# Patient Record
Sex: Female | Born: 1975 | Race: White | Hispanic: Yes | Marital: Married | Smoking: Never smoker
Health system: Southern US, Community
[De-identification: ages and names within clinical notes are randomized; demographics above are authoritative.]

## PROBLEM LIST (undated history)

## (undated) DIAGNOSIS — A599 Trichomoniasis, unspecified: Secondary | ICD-10-CM

## (undated) DIAGNOSIS — R87629 Unspecified abnormal cytological findings in specimens from vagina: Secondary | ICD-10-CM

## (undated) DIAGNOSIS — R030 Elevated blood-pressure reading, without diagnosis of hypertension: Secondary | ICD-10-CM

## (undated) DIAGNOSIS — N644 Mastodynia: Secondary | ICD-10-CM

## (undated) DIAGNOSIS — N63 Unspecified lump in unspecified breast: Secondary | ICD-10-CM

## (undated) HISTORY — DX: Mastodynia: N64.4

## (undated) HISTORY — DX: Trichomoniasis, unspecified: A59.9

## (undated) HISTORY — PX: TUBAL LIGATION: SHX77

## (undated) HISTORY — DX: Unspecified lump in unspecified breast: N63.0

## (undated) HISTORY — DX: Elevated blood-pressure reading, without diagnosis of hypertension: R03.0

## (undated) HISTORY — DX: Unspecified abnormal cytological findings in specimens from vagina: R87.629

---

## 2003-01-07 ENCOUNTER — Inpatient Hospital Stay (HOSPITAL_COMMUNITY): Admission: AD | Admit: 2003-01-07 | Discharge: 2003-01-10 | Payer: Self-pay | Admitting: Obstetrics & Gynecology

## 2004-09-26 ENCOUNTER — Inpatient Hospital Stay (HOSPITAL_COMMUNITY): Admission: RE | Admit: 2004-09-26 | Discharge: 2004-09-28 | Payer: Self-pay | Admitting: Obstetrics and Gynecology

## 2005-02-23 ENCOUNTER — Encounter: Admission: RE | Admit: 2005-02-23 | Discharge: 2005-02-23 | Payer: Self-pay | Admitting: Specialist

## 2014-04-08 ENCOUNTER — Other Ambulatory Visit: Payer: Self-pay | Admitting: Adult Health

## 2014-06-08 ENCOUNTER — Emergency Department (HOSPITAL_COMMUNITY)
Admission: EM | Admit: 2014-06-08 | Discharge: 2014-06-08 | Disposition: A | Discharge status: Home / Self Care | Payer: 59 | Attending: Emergency Medicine | Admitting: Emergency Medicine

## 2014-06-08 ENCOUNTER — Encounter (HOSPITAL_COMMUNITY): Payer: Self-pay | Admitting: Emergency Medicine

## 2014-06-08 ENCOUNTER — Emergency Department (HOSPITAL_COMMUNITY): Payer: 59

## 2014-06-08 DIAGNOSIS — S060X0A Concussion without loss of consciousness, initial encounter: Secondary | ICD-10-CM

## 2014-06-08 DIAGNOSIS — S3992XA Unspecified injury of lower back, initial encounter: Secondary | ICD-10-CM | POA: Insufficient documentation

## 2014-06-08 DIAGNOSIS — Y92410 Unspecified street and highway as the place of occurrence of the external cause: Secondary | ICD-10-CM | POA: Insufficient documentation

## 2014-06-08 DIAGNOSIS — S79922A Unspecified injury of left thigh, initial encounter: Secondary | ICD-10-CM | POA: Diagnosis not present

## 2014-06-08 DIAGNOSIS — Y9389 Activity, other specified: Secondary | ICD-10-CM | POA: Insufficient documentation

## 2014-06-08 DIAGNOSIS — Y998 Other external cause status: Secondary | ICD-10-CM | POA: Insufficient documentation

## 2014-06-08 DIAGNOSIS — S0990XA Unspecified injury of head, initial encounter: Secondary | ICD-10-CM | POA: Diagnosis present

## 2014-06-08 MED ORDER — PROMETHAZINE HCL 25 MG PO TABS: 25.0000 mg | ORAL_TABLET | Freq: Four times a day (QID) | ORAL | Status: DC | PRN

## 2014-06-08 MED ORDER — TRAMADOL HCL 50 MG PO TABS
50.0000 mg | ORAL_TABLET | Freq: Once | ORAL | Status: AC
  Administered 2014-06-08: 50 mg via ORAL
  Filled 2014-06-08: qty 1

## 2014-06-08 MED ORDER — ONDANSETRON 4 MG PO TBDP
4.0000 mg | ORAL_TABLET | Freq: Once | ORAL | Status: AC
  Administered 2014-06-08: 4 mg via ORAL
  Filled 2014-06-08: qty 1

## 2014-06-08 MED ORDER — NAPROXEN 500 MG PO TABS: 500.0000 mg | ORAL_TABLET | Freq: Two times a day (BID) | ORAL | Status: DC

## 2014-06-08 NOTE — Discharge Instructions (Signed)
[facility] Primary Care Doctor List    Sinda Du MD. Specialty: Pulmonary Disease Contact information: [place] [address]  [phone]   Tula Nakayama, MD. Specialty: [facility] Medicine Contact information: [address] [address]  [phone]   Sallee Lange, MD. Specialty: [facility] Medicine Contact information: [place]  [place] [address]  [phone]   Rosita Fire, MD Specialty: Internal Medicine Contact information: [place] [address]  [phone]   Delphina Cahill, MD. Specialty: Internal Medicine Contact information: [place] [address]  [phone]   Marjean Donna, MD. Specialty: Family Medicine Contact information: [place] [address]  [phone]   Leslie Andrea, MD. Specialty: [facility] Medicine Contact information: [place] [address]  [phone]   Asencion Noble, MD. Specialty: Internal Medicine Contact information: [place]  [place] [address]  [phone]
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
## 2014-12-08 ENCOUNTER — Other Ambulatory Visit: Payer: Self-pay | Admitting: Adult Health

## 2014-12-08 ENCOUNTER — Other Ambulatory Visit (HOSPITAL_COMMUNITY)
Admission: RE | Admit: 2014-12-08 | Discharge: 2014-12-08 | Disposition: A | Discharge status: Home / Self Care | Payer: 59 | Source: Ambulatory Visit | Attending: Adult Health | Admitting: Adult Health

## 2014-12-08 ENCOUNTER — Encounter: Payer: Self-pay | Admitting: Adult Health

## 2014-12-08 ENCOUNTER — Ambulatory Visit (INDEPENDENT_AMBULATORY_CARE_PROVIDER_SITE_OTHER): Payer: 59 | Admitting: Adult Health

## 2014-12-08 VITALS — BP 140/90 | HR 72 | Ht 62.0 in | Wt 164.5 lb

## 2014-12-08 DIAGNOSIS — Z1212 Encounter for screening for malignant neoplasm of rectum: Secondary | ICD-10-CM

## 2014-12-08 DIAGNOSIS — Z01419 Encounter for gynecological examination (general) (routine) without abnormal findings: Secondary | ICD-10-CM | POA: Insufficient documentation

## 2014-12-08 DIAGNOSIS — N63 Unspecified lump in unspecified breast: Secondary | ICD-10-CM

## 2014-12-08 DIAGNOSIS — R030 Elevated blood-pressure reading, without diagnosis of hypertension: Secondary | ICD-10-CM

## 2014-12-08 DIAGNOSIS — IMO0001 Reserved for inherently not codable concepts without codable children: Secondary | ICD-10-CM

## 2014-12-08 DIAGNOSIS — Z1151 Encounter for screening for human papillomavirus (HPV): Secondary | ICD-10-CM | POA: Insufficient documentation

## 2014-12-08 DIAGNOSIS — N644 Mastodynia: Secondary | ICD-10-CM

## 2014-12-08 DIAGNOSIS — I10 Essential (primary) hypertension: Secondary | ICD-10-CM | POA: Insufficient documentation

## 2014-12-08 HISTORY — DX: Mastodynia: N64.4

## 2014-12-08 HISTORY — DX: Reserved for inherently not codable concepts without codable children: IMO0001

## 2014-12-08 HISTORY — DX: Unspecified lump in unspecified breast: N63.0

## 2014-12-08 LAB — HEMOCCULT GUIAC POC 1CARD (OFFICE): FECAL OCCULT BLD: NEGATIVE

## 2014-12-08 NOTE — Progress Notes (Signed)
Patient ID: Julie Butler, female   DOB: [birth year], 39 y.o.   MRN: [id number] History of Present Illness: Julie Butler is a 39 year old Hispanic female,G4P4, in for well woman gyn exam and pap.She is complaining of pain in left breast/chest area and has not had pap lately, is thinking had abnormal pap ?cancer but has not had one since.Has pain in both knees at times.  She says she is nervous because she has not seen provider lately.  Current Medications, Allergies, Past Medical History, Past Surgical History, Family History and Social History were reviewed in [employer] record.     Review of Systems: Patient denies any headaches, hearing loss, fatigue, blurred vision, shortness of breath, abdominal pain, problems with bowel movements, urination, or intercourse. No joint pain or mood swings. See HPI for positives.   Physical Exam:BP 140/90 mmHg  Pulse 72  Ht 5\' 2"  (1.575 m)  Wt 164 lb 8 oz (74.617 kg)  BMI 30.08 kg/m2  LMP 11/27/2014 General:  Well developed, well nourished, no acute distress Skin:  Warm and dry Neck:  Midline trachea, normal thyroid, good ROM, no lymphadenopathy Lungs; Clear to auscultation bilaterally Breast:  No dominant palpable mass, retraction, or nipple discharge on the right, on the left has retraction or nipple discharge but has nodule at 1-2 o'clock and 9 o'clock and both are tender Cardiovascular: Regular rate and rhythm Abdomen:  Soft, non tender, no hepatosplenomegaly Pelvic:  External genitalia is normal in appearance, no lesions.  The vagina is normal in appearance. Urethra has no lesions or masses. The cervix is bulbous and friable, pap with HPV performed.  Uterus is felt to be normal size, shape, and contour.  No adnexal masses or tenderness noted.Bladder is non tender, no masses felt. Rectal: Good sphincter tone, no polyps, or hemorrhoids felt.  Hemoccult negative. Extremities/musculoskeletal:  No swelling or varicosities noted, no  clubbing or cyanosis Psych:  No mood changes, alert and cooperative,seems happy   Impression: Well woman gyn exam with pap Breast nodule,left Breast pain,left Elevated BP    Plan: Check CBC,CMP,TSH and lipids Return in 1 week for BP check and review labs Get diagnostic bilateral mammogram 10/18 at 8 am at [facility] in 1 year

## 2014-12-08 NOTE — Patient Instructions (Signed)
Get [facility] 10[facility]18 at 8 [facility] Return in 1 week for BP check and review labs Physical in 1 year

## 2014-12-09 LAB — COMPREHENSIVE METABOLIC PANEL
A/G RATIO: 1.5 (ref 1.1–2.5)
ALT: 13 IU/L (ref 0–32)
AST: 16 IU/L (ref 0–40)
Albumin: 4.3 g/dL (ref 3.5–5.5)
Alkaline Phosphatase: 72 IU/L (ref 39–117)
BUN/Creatinine Ratio: 15 (ref 8–20)
BUN: 10 mg/dL (ref 6–20)
Bilirubin Total: 0.6 mg/dL (ref 0.0–1.2)
CALCIUM: 9.1 mg/dL (ref 8.7–10.2)
CO2: 22 mmol/L (ref 18–29)
CREATININE: 0.65 mg/dL (ref 0.57–1.00)
Chloride: 103 mmol/L (ref 97–108)
GFR, EST AFRICAN AMERICAN: 129 mL/min/{1.73_m2} (ref >59)
GFR, EST NON AFRICAN AMERICAN: 112 mL/min/{1.73_m2} (ref >59)
GLOBULIN, TOTAL: 2.9 g/dL (ref 1.5–4.5)
Glucose: 89 mg/dL (ref 65–99)
POTASSIUM: 4 mmol/L (ref 3.5–5.2)
Sodium: 143 mmol/L (ref 134–144)
TOTAL PROTEIN: 7.2 g/dL (ref 6.0–8.5)

## 2014-12-09 LAB — CBC
HEMATOCRIT: 42.5 % (ref 34.0–46.6)
Hemoglobin: 14.4 g/dL (ref 11.1–15.9)
MCH: 29.3 pg (ref 26.6–33.0)
MCHC: 33.9 g/dL (ref 31.5–35.7)
MCV: 87 fL (ref 79–97)
PLATELETS: 246 10*3/uL (ref 150–379)
RBC: 4.91 x10E6/uL (ref 3.77–5.28)
RDW: 13.6 % (ref 12.3–15.4)
WBC: 5.6 10*3/uL (ref 3.4–10.8)

## 2014-12-09 LAB — LIPID PANEL
CHOL/HDL RATIO: 3.9 ratio (ref 0.0–4.4)
CHOLESTEROL TOTAL: 175 mg/dL (ref 100–199)
HDL: 45 mg/dL (ref >39)
LDL CALC: 101 mg/dL — AB (ref 0–99)
TRIGLYCERIDES: 147 mg/dL (ref 0–149)
VLDL CHOLESTEROL CAL: 29 mg/dL (ref 5–40)

## 2014-12-09 LAB — TSH: TSH: 1.26 u[IU]/mL (ref 0.450–4.500)

## 2014-12-13 LAB — CYTOLOGY - PAP

## 2014-12-14 ENCOUNTER — Ambulatory Visit: Payer: 59 | Admitting: Adult Health

## 2014-12-16 ENCOUNTER — Ambulatory Visit (INDEPENDENT_AMBULATORY_CARE_PROVIDER_SITE_OTHER): Payer: 59 | Admitting: Adult Health

## 2014-12-16 ENCOUNTER — Encounter: Payer: Self-pay | Admitting: Adult Health

## 2014-12-16 VITALS — BP 140/88 | HR 68 | Ht 62.0 in | Wt 165.0 lb

## 2014-12-16 DIAGNOSIS — A599 Trichomoniasis, unspecified: Secondary | ICD-10-CM | POA: Diagnosis not present

## 2014-12-16 DIAGNOSIS — R030 Elevated blood-pressure reading, without diagnosis of hypertension: Secondary | ICD-10-CM

## 2014-12-16 DIAGNOSIS — IMO0001 Reserved for inherently not codable concepts without codable children: Secondary | ICD-10-CM

## 2014-12-16 HISTORY — DX: Trichomoniasis, unspecified: A59.9

## 2014-12-16 MED ORDER — METRONIDAZOLE 500 MG PO TABS: ORAL_TABLET | ORAL | Status: DC

## 2014-12-16 NOTE — Patient Instructions (Addendum)

## 2014-12-16 NOTE — Progress Notes (Signed)
Subjective:     Patient ID: Julie Butler, female   DOB: 15-Jan-1976, 39 y.o.   MRN: [id number]  HPI Julie Butler is a 39 year old Hispanic female in for BP check and review labs.  Review of Systems Patient denies any headaches, hearing loss, fatigue, blurred vision, shortness of breath, chest pain, abdominal pain, problems with bowel movements, urination, or intercourse. No joint pain or mood swings. Reviewed past medical,surgical, social and family history. Reviewed medications and allergies.     Objective:   Physical Exam BP 140/88 mmHg  Pulse 68  Ht 5\' 2"  (1.575 m)  Wt 165 lb (74.844 kg)  BMI 30.17 kg/m2  LMP 09/24/2016Reviewed labs and Pap smear results,had trich on pap will treat, and discussed weight loss to help with BP as she does not wants meds.Face time 10 minutes, counseling, try to lose 1 lb per week,she has appt Tuesday for mammogram.     Assessment:     Trichomoniasis Elevated BP    Plan:    Review handout on weight loss Rx flagyl 500 mg #4 4 po now Try to lose weight Follow up in 7 weeks

## 2014-12-21 ENCOUNTER — Inpatient Hospital Stay (HOSPITAL_COMMUNITY): Admission: RE | Admit: 2014-12-21 | Payer: 59 | Source: Ambulatory Visit

## 2015-01-11 ENCOUNTER — Ambulatory Visit (HOSPITAL_COMMUNITY)
Admission: RE | Admit: 2015-01-11 | Discharge: 2015-01-11 | Disposition: A | Discharge status: Home / Self Care | Payer: 59 | Source: Ambulatory Visit | Attending: Adult Health | Admitting: Adult Health

## 2015-01-11 DIAGNOSIS — N644 Mastodynia: Secondary | ICD-10-CM | POA: Insufficient documentation

## 2015-02-03 ENCOUNTER — Ambulatory Visit: Payer: 59 | Admitting: Adult Health

## 2015-02-07 ENCOUNTER — Ambulatory Visit (INDEPENDENT_AMBULATORY_CARE_PROVIDER_SITE_OTHER): Payer: 59 | Admitting: Adult Health

## 2015-02-07 ENCOUNTER — Encounter: Payer: Self-pay | Admitting: Adult Health

## 2015-02-07 VITALS — BP 140/80 | HR 68 | Ht 62.0 in | Wt 168.5 lb

## 2015-02-07 DIAGNOSIS — A599 Trichomoniasis, unspecified: Secondary | ICD-10-CM

## 2015-02-07 DIAGNOSIS — IMO0001 Reserved for inherently not codable concepts without codable children: Secondary | ICD-10-CM

## 2015-02-07 DIAGNOSIS — R030 Elevated blood-pressure reading, without diagnosis of hypertension: Secondary | ICD-10-CM

## 2015-02-07 LAB — POCT WET PREP (WET MOUNT): WBC WET PREP: NEGATIVE

## 2015-02-07 NOTE — Progress Notes (Signed)
Subjective:     Patient ID: Julie Butler, female   DOB: [birth year], 39 y.o.   MRN: [id number]  HPI Kaleyah is a 39 year old Hispanic female in for proof of treatment on trich and BP check, she says BP like 126/80 at [facility].No complaints today.  Review of Systems Patient denies any headaches, hearing loss, fatigue, blurred vision, shortness of breath, chest pain, abdominal pain, problems with bowel movements, urination, or intercourse. No joint pain or mood swings. Reviewed past medical,surgical, social and family history. Reviewed medications and allergies.     Objective:   Physical Exam BP 140/80 mmHg  Pulse 68  Ht 5\' 2"  (1.575 m)  Wt 168 lb 8 oz (76.431 kg)  BMI 30.81 kg/m2  LMP 01/27/2015 Skin warm and dry. Lungs: clear to ausculation bilaterally. Cardiovascular: regular rate and rhythm.Pelvic: external genitalia is normal in appearance no lesions, vagina: white discharge without odor,urethra has no lesions or masses noted, cervix:smooth and bulbous, uterus: normal size, shape and contour, non tender, no masses felt, adnexa: no masses or tenderness noted. Bladder is non tender and no masses felt. Wet prep: negative     Assessment:     Trichomoniasis resolved Elevated BP    Plan:     Continue to decrease salt and sugar Follow up in 3 months for BP check and ROS

## 2015-02-07 NOTE — Patient Instructions (Signed)
Watch diet  Follow up in 3 months for BP check

## 2015-05-10 ENCOUNTER — Ambulatory Visit: Payer: 59 | Admitting: Adult Health

## 2015-05-16 ENCOUNTER — Ambulatory Visit: Payer: 59 | Admitting: Adult Health

## 2015-05-16 ENCOUNTER — Encounter: Payer: Self-pay | Admitting: *Deleted

## 2017-11-11 ENCOUNTER — Encounter: Payer: Self-pay | Admitting: Obstetrics and Gynecology

## 2017-11-11 ENCOUNTER — Ambulatory Visit (INDEPENDENT_AMBULATORY_CARE_PROVIDER_SITE_OTHER): Payer: Self-pay | Admitting: Obstetrics and Gynecology

## 2017-11-11 ENCOUNTER — Other Ambulatory Visit (HOSPITAL_COMMUNITY)
Admission: RE | Admit: 2017-11-11 | Discharge: 2017-11-11 | Disposition: A | Discharge status: Home / Self Care | Payer: 59 | Source: Ambulatory Visit | Attending: Obstetrics and Gynecology | Admitting: Obstetrics and Gynecology

## 2017-11-11 ENCOUNTER — Encounter (INDEPENDENT_AMBULATORY_CARE_PROVIDER_SITE_OTHER): Payer: Self-pay

## 2017-11-11 VITALS — BP 146/91 | HR 66 | Ht 62.5 in | Wt 180.8 lb

## 2017-11-11 DIAGNOSIS — Z1211 Encounter for screening for malignant neoplasm of colon: Secondary | ICD-10-CM

## 2017-11-11 DIAGNOSIS — Z1212 Encounter for screening for malignant neoplasm of rectum: Secondary | ICD-10-CM

## 2017-11-11 DIAGNOSIS — Z01419 Encounter for gynecological examination (general) (routine) without abnormal findings: Secondary | ICD-10-CM

## 2017-11-11 LAB — HEMOCCULT GUIAC POC 1CARD (OFFICE): FECAL OCCULT BLD: NEGATIVE

## 2017-11-11 NOTE — Progress Notes (Signed)
Patient ID: Julie Butler, female   DOB: 1975/05/17, 42 y.o.   MRN: [id number]   Assessment:  Annual Gyn Exam Plan:  1. pap smear done, next pap due 3 years 2. return annually or prn 3    mammogram at 44 Subjective:  Julie Butler is a 42 y.o. female G4P4 who presents for annual exam. Patient's last menstrual period was 10/17/2017. The patient has complaints today of high blood pressure and pain on her left side. She constant has pain in her stomach. She hasn't changed her diet and certain foods does not make her stomach upset.  When she uses her left hand a lot it makes the pain worse, when she sleeps on right side the pain goes away. She workings as a cleaning lady; sometimes homes and sometimes in stores. She has no family history of breast cancer. LMP 10/17/17 She had all her children vaginally they are 37, 21, 36, 83 The following portions of the patient's history were reviewed and updated as appropriate: allergies, current medications, past family history, past medical history, past social history, past surgical history and problem list. Past Medical History:  Diagnosis Date  . Breast nodule 12/08/2014  . Breast pain, left 12/08/2014  . Elevated BP 12/08/2014  . Trichimoniasis 12/16/2014  . Vaginal Pap smear, abnormal     Past Surgical History:  Procedure Laterality Date  . TUBAL LIGATION      No current outpatient medications on file.  Review of Systems Constitutional: negative Gastrointestinal: negative Genitourinary: normal  Objective:  BP (!) 149/100 (BP Location: Right Arm, Patient Position: Sitting, Cuff Size: Normal)   Pulse 73   Ht 5' 2.5" (1.588 m)   Wt 180 lb 12.8 oz (82 kg)   LMP 10/17/2017   BMI 32.54 kg/m    BMI: Body mass index is 32.54 kg/m.  General Appearance: Alert, appropriate appearance for age. No acute distress HEENT: Grossly normal Neck / Thyroid:  Cardiovascular: RRR; normal S1, S2, no murmur Lungs: CTA bilaterally Back: No CVAT Breast  Exam: No dimpling, nipple retraction or discharge. No masses or nodes. and Normal breast tissue bilaterally Gastrointestinal: Soft, non-tender, no masses or organomegaly Pelvic Exam:  VAGINA: normal apperaing CERVIX: multip, normal secretions, bleeds slightly when pap collected UTERUS: normal, anterior RECTAL: guaiac negative stool obtained,  PAP: Pap smear done today.  Lymphatic Exam: Non-palpable nodes in neck, clavicular, axillary, or inguinal regions  Skin: no rash or abnormalities Neurologic: Normal gait and speech, no tremor  Psychiatric: Alert and oriented, appropriate affect.  Urinalysis:Not done  By signing my name below, I, Samul Dada, attest that this documentation has been prepared under the direction and in the presence of Jonnie Kind, MD. Electronically Signed: Babbie. 11/11/17. 11:28 AM.  I personally performed the services described in this documentation, which was SCRIBED in my presence. The recorded information has been reviewed and considered accurate. It has been edited as necessary during review. Jonnie Kind, MD

## 2017-11-12 LAB — CYTOLOGY - PAP
Diagnosis: NEGATIVE
HPV (WINDOPATH): NOT DETECTED

## 2019-01-16 ENCOUNTER — Other Ambulatory Visit: Payer: Self-pay | Admitting: *Deleted

## 2019-01-16 DIAGNOSIS — Z20822 Contact with and (suspected) exposure to covid-19: Secondary | ICD-10-CM

## 2019-01-19 LAB — NOVEL CORONAVIRUS, NAA: SARS-CoV-2, NAA: DETECTED — AB

## 2019-01-22 ENCOUNTER — Encounter: Payer: Self-pay | Admitting: *Deleted

## 2019-02-03 ENCOUNTER — Other Ambulatory Visit: Payer: Self-pay

## 2019-02-03 ENCOUNTER — Encounter: Payer: Self-pay | Admitting: Adult Health

## 2019-02-03 ENCOUNTER — Ambulatory Visit: Payer: Self-pay | Admitting: Adult Health

## 2019-02-03 VITALS — BP 130/87 | HR 78 | Ht 62.5 in | Wt 178.6 lb

## 2019-02-03 DIAGNOSIS — Z01419 Encounter for gynecological examination (general) (routine) without abnormal findings: Secondary | ICD-10-CM

## 2019-02-03 DIAGNOSIS — N3946 Mixed incontinence: Secondary | ICD-10-CM | POA: Insufficient documentation

## 2019-02-03 DIAGNOSIS — Z1212 Encounter for screening for malignant neoplasm of rectum: Secondary | ICD-10-CM

## 2019-02-03 DIAGNOSIS — Z1211 Encounter for screening for malignant neoplasm of colon: Secondary | ICD-10-CM

## 2019-02-03 LAB — HEMOCCULT GUIAC POC 1CARD (OFFICE): Fecal Occult Blood, POC: NEGATIVE

## 2019-02-03 NOTE — Progress Notes (Signed)
Patient ID: Julie Butler, female   DOB: 08-05-1975, 43 y.o.   MRN: [id number] History of Present Illness: Julie Butler is a 43 year old Hispanic female, married, G4P4 in for a well woman gyn exam, she had a normal pap with negative HPV 11/11/17. Julie Butler is her interpreter today.  Current Medications, Allergies, Past Medical History, Past Surgical History, Family History and Social History were reviewed in [employer] record.     Review of Systems: Patient denies any headaches, hearing loss, fatigue, blurred vision, shortness of breath, chest pain, abdominal pain, problems with bowel movements,  or intercourse. No joint pain or mood swings. Has leakage if coughs or sneezes, and some urge incontinence  Has some night sweats Periods regular and last 2-3 days   Physical Exam:BP 130/87 (BP Location: Left Arm, Patient Position: Sitting, Cuff Size: Normal)   Pulse 78   Ht 5' 2.5" (1.588 m)   Wt 178 lb 9.6 oz (81 kg)   LMP 01/18/2019 (Exact Date)   BMI 32.15 kg/m  General:  Well developed, well nourished, no acute distress Skin:  Warm and dry Neck:  Midline trachea, normal thyroid, good ROM, no lymphadenopathy Lungs; Clear to auscultation bilaterally Breast:  No dominant palpable mass, retraction, or nipple discharge Cardiovascular: Regular rate and rhythm Abdomen:  Soft, non tender, no hepatosplenomegaly Pelvic:  External genitalia is normal in appearance, no lesions.  The vagina is normal in appearance. Urethra has no lesions or masses. The cervix is bulbous.  Uterus is felt to be normal size, shape, and contour.  No adnexal masses or tenderness noted.Bladder is non tender, no masses felt. Rectal: Good sphincter tone, no polyps, or hemorrhoids felt.  Hemoccult negative. Extremities/musculoskeletal:  No swelling or varicosities noted, no clubbing or cyanosis Psych:  No mood changes, alert and cooperative,seems happy Fall risk is low PHQ 2 score is 0. Examination chaperoned  by Rolena Infante LPN.  Impression and Plan: 1. Encounter for well woman exam with routine gynecological exam Pap in 2022 Physical in 1 year Get mammogram   2. Screening for colorectal cancer   3. Mixed stress and urge urinary incontinence Try to lose 10 lbs Do kegel's Decrease caffeine Pee often Review handouts on kegel's and UI If not better call, can try ditropan

## 2019-02-03 NOTE — Patient Instructions (Signed)
Ejercicios de Scientist, research (physical sciences)  Los ejercicios de Kegel pueden ayudar a fortalecer los msculos del suelo plvico. El suelo plvico est formado por un grupo de msculos que sostienen el recto, el intestino delgado y la vejiga. En las mujeres, los msculos del suelo plvico tambin sostienen el tero. Estos msculos ayudan a controlar el flujo de [place] y materia fecal (heces). Los ejercicios de Kegel son indoloros y simples, y no requieren ningn equipo. El mdico puede sugerir ejercicios de Kegel para:  Mejorar el control de la vejiga y los intestinos.  Mejorar la respuesta sexual.  Mejorar los msculos dbiles del suelo plvico despus de una ciruga para extirpar el tero (histerectoma) o de un embarazo (mujeres).  Mejorar los msculos del suelo plvico dbiles despus de la extirpacin o ciruga de la prstata (hombres). Los ejercicios de Kegel consisten en contraer los msculos del suelo plvico, que son los mismos que contrae al intentar detener el flujo de la orina o evitar eliminar gases. Estos ejercicios se pueden Regulatory affairs officer est sentado, parado o [place], pero lo mejor es variar la posicin. Ejercicios Cmo hacer los ejercicios de Kegel: 1. Contraiga con fuerza los msculos del suelo plvico. Debe sentir que el rea rectal se eleva y se tensa. Si es [place], tambin debe sentir tensin en el rea vaginal. Mantenga el [place], las nalgas y las piernas [place]. 2. Mantenga los msculos tensos durante 10segundos como mximo. 3. Respire con normalidad. 4. Relaje los msculos. 5. Repita como se lo haya indicado el mdico. Repita este ejercicio a diario como se lo haya indicado el mdico. Contine haciendo este ejercicio durante al menos 4 a 6semanas o el tiempo que le haya indicado su mdico. Pueden derivarlo a un fisioterapeuta que lo puede ayudar a aprender ms sobre cmo hacer los ejercicios de Kegel. Segn sea su estado, el mdico puede recomendarle lo siguiente:   Variar cunto [employer].  Hacer varias series de ejercicios [employer].  Hacer ejercicios durante varias semanas.  Convertir los ejercicios de Kegel en parte de su rutina de ejercicios habitual. Esta informacin no tiene Marine scientist el consejo del mdico. Asegrese de hacerle al mdico cualquier pregunta que tenga. Document Released: 02/06/2012 Document Revised: 11/20/2017 Document Reviewed: 11/20/2017 Elsevier Patient Education  2020 [place] Urinary Incontinence  La incontinencia urinaria es una afeccin en la cual una persona no puede controlar dnde y Financial risk analyst. Ardelia Mems persona con esta afeccin orinar en un momento y Engineer, civil (consulting) no deseados (involuntariamente). Cules son las causas? Esta afeccin puede ser causada por lo siguiente:  Medicamentos.  Infecciones.  Estreimiento.  Msculos de la vejiga hiperactivos.  Msculos de la vejiga dbiles.  Msculos del suelo de la pelvis dbiles. Estos msculos proporcionan apoyo a la vejiga, el intestino y el tero, en el caso de las [place].  Aumento del tamao de la prstata en los hombres. La prstata es una glndula cerca de la vejiga. Cuando se [place], puede comprimir la [place]. Si la uretra est obstruida, la vejiga puede debilitarse y perder la capacidad para vaciarse correctamente.  Ciruga.  Factores emocionales, como la ansiedad, el estrs o el trastorno de estrs postraumtico (TEP).  Prolapso de los rganos de la pelvis. Esto ocurre en las mujeres, cuando los rganos se salen de Environmental consultant, hacia la vagina. Este cambio de lugar puede evitar que la vejiga y la uretra funcionen correctamente. Qu incrementa el riesgo? Los siguientes factores pueden hacer que usted sea ms propenso a  tener esta afeccin:  Edad avanzada.  Obesidad e inactividad fsica.  Embarazo y [place].  Menopausia.  Las enfermedades que [employer] nervios o la mdula espinal  (enfermedades neurolgicas).  Tos de larga duracin (crnica). Esto puede aumentar la presin en los msculos de la vejiga y del suelo plvico. Cules son los signos o los sntomas? Los sntomas pueden variar, segn el tipo de incontinencia urinaria que tenga. Estos incluyen los siguientes:  Una necesidad repentina de Garment/textile technologist, y orinar involuntariamente antes de llegar al bao (incontinencia imperiosa).  Orinar repentinamente al realizar una actividad que provoque la miccin, como toser, rer, Field seismologist ejercicio o estornudar (incontinencia urinaria de esfuerzo).  Tener ganas de orinar con frecuencia, y Garment/textile technologist en pequeas cantidades o por goteo (incontinencia por rebosamiento).  Orinar porque no puede llegar al bao a tiempo a causa de una discapacidad fsica, como la artritis o una lesin, o de afecciones comunicativas o cognitivas, como la enfermedad de Alzheimer (incontinencia funcional). Cmo se diagnostica? Esta afeccin se puede diagnosticar en funcin de lo siguiente:  Sus antecedentes mdicos.  Un examen fsico.  Estudios, como por ejemplo: ? Anlisis de [place]. ? Radiografas del rin y de la vejiga. ? Ecografa. ? Exploracin por tomografa computarizada (TC). ? Cistoscopia. En este procedimiento, el mdico inserta un tubo con Ardelia Mems luz y Carlota Raspberry (cistoscopio) por la uretra hacia la vejiga para Education officer, environmental. ? Estudios urodinmicos. Estos estudios evalan la capacidad de la vejiga, de la uretra y del esfnter para Financial controller y Radio broadcast assistant orina. Hay distintos tipos de estudios urodinmicos, que pueden variar segn lo que Geophysical data processor. Para ayudar a diagnosticar la afeccin, el mdico puede recomendarle que mantenga un registro de la frecuencia con la que [place] y de la cantidad de [place]. Cmo se trata? El tratamiento de esta afeccin depende del tipo de incontinencia que tenga y de su causa. El tratamiento puede incluir lo siguiente:  Cambios en el estilo de  vida, como por ejemplo: ? Dejar de fumar. ? Mantener un peso saludable. ? Mantenerse activo. Trate de hacer 144minutos de ejercicio de intensidad moderada cada semana. Pregntele al mdico qu actividades son seguras para usted. ? Consumir una dieta saludable.  Evite los alimentos con alto contenido de grasa, como los alimentos fritos.  Reduzca el consumo de carbohidratos refinados, como el pan blanco y el arroz blanco.  Limite la cantidad de cafena que consume.  Aumente el consumo de [place]. Los alimentos como frutas y verduras frescas, los frijoles y los cereales integrales son fuentes saludables de [place].  Ejercicios para el msculo del piso plvico.  Ejercitacin de la vejiga, como prolongar la cantidad de [employer] las idas al bao o ir al bao en intervalos regulares.  Aplicar tcnicas para reducir las urgencias de la vejiga. Esto puede incluir tcnicas de distraccin o ejercicios de respiracin controlada.  Medicamentos para Media planner los msculos de la vejiga y Product/process development scientist los espasmos de la vejiga.  Medicamentos para disminuir o Geographical information systems officer de la prstata, en el caso de los hombres.  Inyecciones de btox. Esto puede ayudar a [employer] de la vejiga.  Usar pulsos elctricos para alterar los reflejos de la vejiga (estimulacin nerviosa elctrica).  En el caso de las [place] un dispositivo mdico para evitar prdidas de [place]. Este es un dispositivo pequeo y Insurance risk surveyor, parecido a un tampn, que se inserta en la uretra.  Inyectar colgeno o perlas de carbono (espesantes) en el esfnter urinario. Esto puede ayudar a espesar el tejido  y cerrar la abertura de la vejiga.  Ciruga. Siga estas indicaciones en su casa: Estilo de vida  Limite el consumo de alcohol y cafena. Estos pueden llenar la vejiga rpidamente e irritarla.  Mantenga su higiene personal para evitar malos olores y lesiones cutneas. Pregntele al mdico acerca de cremas y productos  para la piel que puedan proteger la piel de la [place].  Considere usar compresas o paales para adultos. Asegrese de cambiarlos con regularidad y siempre cmbielos tras un episodio de incontinencia. Instrucciones generales  [employer] de venta libre y los recetados solamente como se lo haya indicado el mdico.  Trate de ir al bao cada 3 o 4 horas, aunque no sienta la necesidad de Garment/textile technologist. Tmese el tiempo para vaciar la vejiga completamente cada vez que orine. Despus de orinar, espere un minuto. Luego trate de orinar nuevamente.  Adopte una posicin cmoda para orinar.  Si la causa de su incontinencia son problemas nerviosos, lleve un registro de los medicamentos que toma y de las veces que vaya al bao.  Concurra a todas las visitas de seguimiento como se lo haya indicado el mdico. Esto es importante. Comunquese con un mdico si:  Siente un dolor que empeora.  La incontinencia empeora. Solicite ayuda de inmediato si:  Tiene fiebre o siente escalofros.  No puede orinar.  Tiene enrojecimiento en la zona de la ingle o en las piernas. Resumen  La incontinencia urinaria es una afeccin en la cual una persona no puede controlar dnde y Financial risk analyst.  Esta afeccin puede ser causada por medicamentos, infecciones, debilidad en los msculos de la vejiga, debilidad en los msculos del suelo plvico, agrandamiento de la prstata (en los hombres) o [place].  Los siguientes factores aumentan el riesgo de tener esta afeccin: Salena Saner, obesidad, [place] y [place], menopausia, enfermedades neurolgicas y tos crnica.  Hay muchos tipos de incontinencia urinaria. Estos incluyen incontinencia imperiosa, incontinencia urinaria de esfuerzo, incontinencia por rebosamiento e incontinencia funcional.  Por lo general, esta afeccin se trata primero con cambios en el estilo de vida y el comportamiento, como dejar de fumar, adoptar una alimentacin saludable y Field seismologist ejercicios para el  suelo plvico con regularidad. Otras opciones de tratamiento incluyen medicamentos, espesantes, dispositivos mdicos, neuroestimulacin elctrica o ciruga. Esta informacin no tiene Marine scientist el consejo del mdico. Asegrese de hacerle al mdico cualquier pregunta que tenga. Document Released: 02/19/2005 Document Revised: 03/01/2017 Document Reviewed: 09/25/2016 Elsevier Patient Education  [place].

## 2019-07-06 ENCOUNTER — Ambulatory Visit (INDEPENDENT_AMBULATORY_CARE_PROVIDER_SITE_OTHER): Payer: 59 | Admitting: Dermatology

## 2019-07-06 ENCOUNTER — Other Ambulatory Visit: Payer: Self-pay

## 2019-07-06 DIAGNOSIS — L309 Dermatitis, unspecified: Secondary | ICD-10-CM | POA: Diagnosis not present

## 2019-07-06 DIAGNOSIS — L680 Hirsutism: Secondary | ICD-10-CM | POA: Diagnosis not present

## 2019-07-06 MED ORDER — TRIAMCINOLONE ACETONIDE 0.5 % EX OINT: 1.0000 "application " | TOPICAL_OINTMENT | Freq: Once | CUTANEOUS | 1 refills | Status: AC

## 2019-07-06 NOTE — Patient Instructions (Addendum)
Interpretor in room. 2 skin problems today.  1 is an itchy breaking out mainly circumferentially around the neck with some involvement of the outer lower face.  Examination showed a subtle micropapular dermatitis of uncertain etiology.  I explained that this could come either from outside the skin like a contact dermatitis or inside the skin like an adult eczema.  Initially Janessa will use topical triamcinolone daily after bathing for 4 weeks.  If there was no improvement, she will add oral minocycline 50mg  twice daily.  I explained that this can occasionally cause stomach upset,, vaginal yeast infection, dizziness, or rash.  If there is still no improvement, I hope to see Joylin back in 10 weeks and we will consider patch testing.  The second issue we discussed was a recent increase in facial hair.  I discussed 3 medical treatment options.  The first would be of Vaniqa cream twice daily but I would avoid using this in the face of an active dermatitis.  We also discussed both electrolysis and laser hair removal.  If she wishes to pursue these, I will try to find her an operator experienced in treating skin of color.

## 2019-07-13 ENCOUNTER — Encounter: Payer: Self-pay | Admitting: Dermatology

## 2019-07-13 NOTE — Progress Notes (Signed)
   Follow-Up Visit   Subjective  Julie Butler is a 44 y.o. female who presents for the following: New Patient (Initial Visit) (Here for itchy rash around neck, back and chest area very itchy. Been going on for a month. patient also has some facial hair thinks it was from a medication so she stopped medication. Patient does not remember what medication was a hormonal pill has been a long time. ).  Rash Location: Face and neck Duration: Months Quality: Persistent Associated Signs/Symptoms: Itch Modifying Factors:  Severity:  Timing: Context:   The following portions of the chart were reviewed this encounter and updated as appropriate:     Objective  Well appearing patient in no apparent distress; mood and affect are within normal limits.  A focused examination was performed including Head and neck and upper chest examined. Relevant physical exam findings are noted in the Assessment and Plan.   Assessment & Plan  Dermatitis (5) Neck - Anterior (2); Neck - Posterior; Left Anterior Mandible; Right Anterior Mandible  Rx TAC ointment qhs for 4-6 weeks. If no improvement, may call and will add MCN 50, b.I.d. for 6 weeks.  Hirsutism (2) Head - Anterior (Face)  Discussed Vaniqa, electrolysis, and laser Interpretor in room. 2 skin problems today.  1 is an itchy breaking out mainly circumferentially around the neck with some involvement of the outer lower face.  Examination showed a subtle micropapular dermatitis of uncertain etiology.  I explained that this could come either from outside the skin like a contact dermatitis or inside the skin like an adult eczema.  Initially Julie Butler will use topical triamcinolone daily after bathing for 4 weeks.  If there was no improvement, she will add oral minocycline 50mg  twice daily.  I explained that this can occasionally cause stomach upset,, vaginal yeast infection, dizziness, or rash.  If there is still no improvement, I hope to see Julie Butler  back in 10 weeks and we will consider patch testing.  The second issue we discussed was a recent increase in facial hair.  I discussed 3 medical treatment options.  The first would be of Vaniqa cream twice daily but I would avoid using this in the face of an active dermatitis.  We also discussed both electrolysis and laser hair removal.  If she wishes to pursue these, I will try to find her an operator experienced in treating skin of color.

## 2019-07-16 ENCOUNTER — Ambulatory Visit
Admission: EM | Admit: 2019-07-16 | Discharge: 2019-07-16 | Disposition: A | Discharge status: Home / Self Care | Payer: 59 | Attending: Emergency Medicine | Admitting: Emergency Medicine

## 2019-07-16 DIAGNOSIS — R5383 Other fatigue: Secondary | ICD-10-CM | POA: Diagnosis not present

## 2019-07-16 DIAGNOSIS — Z139 Encounter for screening, unspecified: Secondary | ICD-10-CM

## 2019-07-16 DIAGNOSIS — H6591 Unspecified nonsuppurative otitis media, right ear: Secondary | ICD-10-CM

## 2019-07-16 MED ORDER — CETIRIZINE HCL 10 MG PO TABS: 10.0000 mg | ORAL_TABLET | Freq: Every day | ORAL | 0 refills | Status: DC

## 2019-07-16 MED ORDER — FLUTICASONE PROPIONATE 50 MCG/ACT NA SUSP: 1.0000 | Freq: Every day | NASAL | 0 refills | Status: DC

## 2019-07-16 NOTE — ED Triage Notes (Signed)
Pt presents with c/o headache and fatigue. Took tylenol for headache earlier

## 2019-07-16 NOTE — Discharge Instructions (Signed)
CBC, CMP, TSH, hemoglobin A1c were completed at this visit Zyrtec and Flonase were prescribed Follow-up and establish care with PCP Primary care resource was provided Return or go to ED for worsening symptoms.

## 2019-07-16 NOTE — ED Provider Notes (Signed)
[facility]    CSN: [place]:[id number] Arrival date & time: 07/16/19  0840      History   Chief Complaint Chief Complaint  Patient presents with  . Headache  . Fatigue    HPI Julie Butler is a 44 y.o. female.   Who presented to the urgent care with a complaint of headache, back pain and fatigue for the past few days.  Denies any precipitating event or exposure to flu, strep, and Covid. She report her headache is intermittent and achy in character.  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  She has tried OTC medications with mild relief.  Her symptoms are made worse with standing for very long.  Does not have a PCP.  She would like to have physical labs completed.  She denies similar symptoms in the past.  She denies chills, fever, nausea, vomiting, diarrhea.      Past Medical History:  Diagnosis Date  . Breast nodule 12/08/2014  . Breast pain, left 12/08/2014  . Elevated BP 12/08/2014  . Trichimoniasis 12/16/2014  . Vaginal Pap smear, abnormal     Patient Active Problem List   Diagnosis Date Noted  . 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 Screening for colorectal cancer 02/03/2019  . Encounter for well woman exam with routine gynecological exam 02/03/2019  . Trichimoniasis 12/16/2014  . Breast nodule 12/08/2014  . Breast pain, left 12/08/2014  . Elevated BP 12/08/2014    Past Surgical History:  Procedure Laterality Date  . TUBAL LIGATION      OB History    Gravida  4   Para  4   Term      Preterm      AB      Living  4     SAB      TAB      Ectopic      Multiple      Live Births               Home Medications    Prior to Admission medications   Medication Sig Start Date End Date Taking? Authorizing Provider  cetirizine (ZYRTEC ALLERGY) 10 MG tablet Take 1 tablet (10 mg total) by mouth daily. 07/16/19   Ernestene Coover, Darrelyn Hillock, FNP  fluticasone (FLONASE) 50 MCG/ACT nasal spray  Place 1 spray into both nostrils daily for 14 days. 07/16/19 07/30/19  Emerson Monte, FNP    Family History Family History  Problem Relation Age of Onset  . Hyperlipidemia Mother   . Hypertension Mother   . Diabetes Mother   . Hypertension Father   . Hyperlipidemia Father   . Heart attack Father   . Diabetes Sister   . Hypertension Sister   . Hyperlipidemia Sister     Social History Social History   Tobacco Use  . Smoking status: Never Smoker  . Smokeless tobacco: Never Used  Substance Use Topics  . Alcohol use: No  . Drug use: No     Allergies   Patient has no known allergies.   Review of Systems Review of Systems  Constitutional: Positive for fatigue.  HENT: Negative.   Respiratory: Negative.   Cardiovascular: Negative.   Gastrointestinal: Negative.   Musculoskeletal: Positive for back pain.  Neurological: Positive for headaches.  All other systems reviewed and are negative.    Physical Exam Triage Vital Signs ED Triage Vitals  Enc Vitals Group     BP 07/16/19 0855 Marland Kitchen)  139/95     Pulse Rate 07/16/19 0855 72     Resp 07/16/19 0855 18     Temp 07/16/19 0855 98.5 F (36.9 C)     Temp src --      SpO2 07/16/19 0855 97 %     Weight --      Height --      Head Circumference --      Peak Flow --      Pain Score 07/16/19 0853 5     Pain Loc --      Pain Edu? --      Excl. in [place]? --    No data found.  Updated Vital Signs BP (!) 139/95   Pulse 72   Temp 98.5 F (36.9 C)   Resp 18   LMP 07/14/2019   SpO2 97%   Visual Acuity Right Eye Distance:   Left Eye Distance:   Bilateral Distance:    Right Eye Near:   Left Eye Near:    Bilateral Near:     Physical Exam Vitals and nursing note reviewed.  Constitutional:      General: She is not in acute distress.    Appearance: Normal appearance. She is normal weight. She is not ill-appearing or toxic-appearing.  HENT:     Head: Normocephalic.     Right Ear: Ear canal and external ear normal.  A middle ear effusion is present. There is no impacted cerumen.     Left Ear: Tympanic membrane, ear canal and external ear normal. There is no impacted cerumen.     Nose: Nose normal. No congestion.     Mouth/Throat:     Mouth: Mucous membranes are moist.     Pharynx: Oropharynx is clear. No oropharyngeal exudate or posterior oropharyngeal erythema.  Cardiovascular:     Rate and Rhythm: Normal rate and regular rhythm.     Pulses: Normal pulses.     Heart sounds: Normal heart sounds. No murmur.  Pulmonary:     Effort: Pulmonary effort is normal. No respiratory distress.     Breath sounds: Normal breath sounds. No wheezing or rhonchi.  Chest:     Chest wall: No tenderness.  Abdominal:     General: Abdomen is flat. Bowel sounds are normal. There is no distension.     Palpations: There is no mass.     Tenderness: There is no abdominal tenderness.     Hernia: No hernia is present.  Musculoskeletal:     Cervical back: Normal.     Thoracic back: Normal.     Lumbar back: Normal. No tenderness.  Skin:    Capillary Refill: Capillary refill takes less than 2 seconds.  Neurological:     General: No focal deficit present.     Mental Status: She is alert and oriented to person, place, and time.     Cranial Nerves: Cranial nerves are intact.     Sensory: Sensation is intact.     Motor: Motor function is intact.     Coordination: Coordination is intact.     Gait: Gait is intact.      UC Treatments / Results  Labs (all labs ordered are listed, but only abnormal results are displayed) Labs Reviewed  CBC WITH DIFFERENTIAL/PLATELET  COMPREHENSIVE METABOLIC PANEL  HEMOGLOBIN A1C  TSH  LIPID PANEL    EKG   Radiology No results found.  Procedures Procedures (including critical care time)  Medications Ordered in UC Medications - No data to display  Initial Impression / Assessment and Plan / UC Course  I have reviewed the triage vital signs and the nursing notes.  Pertinent  labs & imaging results that were available during my care of the patient were reviewed by me and considered in my medical decision making (see chart for details).   Patient is stable at discharge.  Screening lab work completed at this visit and she was advised to establish care with a PCP for reevaluation and management.  Final Clinical Impressions(s) / UC Diagnoses   Final diagnoses:  Encounter for health-related screening  Other fatigue  Middle ear effusion, right     Discharge Instructions     CBC, CMP, TSH, hemoglobin A1c were completed at this visit Zyrtec and Flonase were prescribed Follow-up and establish care with PCP Primary care resource was provided Return or go to ED for worsening symptoms.    ED Prescriptions    Medication Sig Dispense Auth. Provider   cetirizine (ZYRTEC ALLERGY) 10 MG tablet Take 1 tablet (10 mg total) by mouth daily. 30 tablet Mackensi Mahadeo S, FNP   fluticasone (FLONASE) 50 MCG/ACT nasal spray Place 1 spray into both nostrils daily for 14 days. 16 g Emerson Monte, FNP     PDMP not reviewed this encounter.   Emerson Monte, [place] 07/16/19 [phone]

## 2019-07-17 LAB — COMPREHENSIVE METABOLIC PANEL
ALT: 17 IU/L (ref 0–32)
AST: 20 IU/L (ref 0–40)
Albumin/Globulin Ratio: 1.6 (ref 1.2–2.2)
Albumin: 4.2 g/dL (ref 3.8–4.8)
Alkaline Phosphatase: 88 IU/L (ref 39–117)
BUN/Creatinine Ratio: 15 (ref 9–23)
BUN: 9 mg/dL (ref 6–24)
Bilirubin Total: 0.4 mg/dL (ref 0.0–1.2)
CO2: 23 mmol/L (ref 20–29)
Calcium: 9 mg/dL (ref 8.7–10.2)
Chloride: 105 mmol/L (ref 96–106)
Creatinine, Ser: 0.6 mg/dL (ref 0.57–1.00)
GFR calc Af Amer: 128 mL/min/{1.73_m2} (ref >59)
GFR calc non Af Amer: 111 mL/min/{1.73_m2} (ref >59)
Globulin, Total: 2.7 g/dL (ref 1.5–4.5)
Glucose: 100 mg/dL — ABNORMAL HIGH (ref 65–99)
Potassium: 4.3 mmol/L (ref 3.5–5.2)
Sodium: 139 mmol/L (ref 134–144)
Total Protein: 6.9 g/dL (ref 6.0–8.5)

## 2019-07-17 LAB — HEMOGLOBIN A1C
Est. average glucose Bld gHb Est-mCnc: 120 mg/dL
Hgb A1c MFr Bld: 5.8 % — ABNORMAL HIGH (ref 4.8–5.6)

## 2019-07-17 LAB — LIPID PANEL
Chol/HDL Ratio: 3.1 ratio (ref 0.0–4.4)
Cholesterol, Total: 150 mg/dL (ref 100–199)
HDL: 49 mg/dL (ref >39)
LDL Chol Calc (NIH): 85 mg/dL (ref 0–99)
Triglycerides: 85 mg/dL (ref 0–149)
VLDL Cholesterol Cal: 16 mg/dL (ref 5–40)

## 2019-07-17 LAB — CBC WITH DIFFERENTIAL/PLATELET
Basophils Absolute: 0 10*3/uL (ref 0.0–0.2)
Basos: 1 %
EOS (ABSOLUTE): 0.1 10*3/uL (ref 0.0–0.4)
Eos: 2 %
Hematocrit: 38 % (ref 34.0–46.6)
Hemoglobin: 12.5 g/dL (ref 11.1–15.9)
Immature Grans (Abs): 0 10*3/uL (ref 0.0–0.1)
Immature Granulocytes: 0 %
Lymphocytes Absolute: 2 10*3/uL (ref 0.7–3.1)
Lymphs: 31 %
MCH: 26.2 pg — ABNORMAL LOW (ref 26.6–33.0)
MCHC: 32.9 g/dL (ref 31.5–35.7)
MCV: 80 fL (ref 79–97)
Monocytes Absolute: 0.3 10*3/uL (ref 0.1–0.9)
Monocytes: 5 %
Neutrophils Absolute: 3.9 10*3/uL (ref 1.4–7.0)
Neutrophils: 61 %
Platelets: 316 10*3/uL (ref 150–450)
RBC: 4.78 x10E6/uL (ref 3.77–5.28)
RDW: 14 % (ref 11.7–15.4)
WBC: 6.5 10*3/uL (ref 3.4–10.8)

## 2019-07-17 LAB — TSH: TSH: 1.3 u[IU]/mL (ref 0.450–4.500)

## 2019-08-03 NOTE — Addendum Note (Signed)
Addended by: Lavonna Monarch on: 08/03/2019 06:38 PM   Modules accepted: Level of Service

## 2019-08-04 ENCOUNTER — Telehealth (INDEPENDENT_AMBULATORY_CARE_PROVIDER_SITE_OTHER): Payer: 59 | Admitting: Dermatology

## 2019-08-04 DIAGNOSIS — Z5181 Encounter for therapeutic drug level monitoring: Secondary | ICD-10-CM

## 2019-08-04 NOTE — Telephone Encounter (Signed)
if the cream has not helped the breaking out on her neck, we'll phone in for a stronger rash cream to be applied nightly for just three weeks and NOT to be used on the face. Halobetasol cream OR ointment. If the rash is still no better, the next step is to schedule patch testing.

## 2019-08-04 NOTE — Telephone Encounter (Signed)
Med isn't working; was told to call + ask for something else if this is the case. Pharmacy: [facility]

## 2019-08-05 ENCOUNTER — Encounter: Payer: Self-pay | Admitting: Internal Medicine

## 2019-08-05 ENCOUNTER — Other Ambulatory Visit: Payer: Self-pay

## 2019-08-05 ENCOUNTER — Ambulatory Visit (INDEPENDENT_AMBULATORY_CARE_PROVIDER_SITE_OTHER): Payer: 59 | Admitting: Internal Medicine

## 2019-08-05 VITALS — BP 136/84 | HR 94 | Temp 98.7°F | Resp 15 | Ht 65.0 in | Wt 183.0 lb

## 2019-08-05 DIAGNOSIS — R0789 Other chest pain: Secondary | ICD-10-CM | POA: Diagnosis not present

## 2019-08-05 DIAGNOSIS — Z114 Encounter for screening for human immunodeficiency virus [HIV]: Secondary | ICD-10-CM | POA: Diagnosis not present

## 2019-08-05 DIAGNOSIS — Z23 Encounter for immunization: Secondary | ICD-10-CM

## 2019-08-05 DIAGNOSIS — Z1231 Encounter for screening mammogram for malignant neoplasm of breast: Secondary | ICD-10-CM | POA: Diagnosis not present

## 2019-08-05 DIAGNOSIS — R7303 Prediabetes: Secondary | ICD-10-CM

## 2019-08-05 DIAGNOSIS — Z7689 Persons encountering health services in other specified circumstances: Secondary | ICD-10-CM

## 2019-08-05 DIAGNOSIS — R5383 Other fatigue: Secondary | ICD-10-CM

## 2019-08-05 MED ORDER — IBUPROFEN 600 MG PO TABS: 600.0000 mg | ORAL_TABLET | Freq: Three times a day (TID) | ORAL | 0 refills | Status: DC | PRN

## 2019-08-05 NOTE — Progress Notes (Signed)
New Patient Office Visit  Subjective:  Patient ID: Julie Butler, female    DOB: 1975/07/16  Age: 44 y.o. MRN: [id number]  CC:  Chief Complaint  Patient presents with  . New Patient (Initial Visit)    establish care    HPI Julie Butler is a very pleasant 44 year old female with no significant past medical history presents to our office for establishment of care.  Patient speaks Spanish.  History gathered from interpreter in the room.  Patient tells me that she has left-sided neck pain which radiates to her upper chest since 1 year.  Pain is 4-5 out of 10 in intensity, intermittent, aggravates with certain movements, walk/run and relieves with nothing.  Denies association with decreased range of motion, recent heavy lifting, trauma, fall, fever, chills, cough, congestion, shortness of breath, palpitation, rash, history of shingles, decreased appetite.  She recently evaluated by dermatology due to dermatitis and hirsutism.  I reviewed her recent labs and discussed with the patient and she verbalized understanding.  Reports COVID-19 infection in November and since then she has been feeling fatigued and tired.  She works as a Actuary at [employer].  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  She denies low mood, depression, suicidal or homicidal thoughts.  She is due for screening mammogram.  Her last mammogram was in 2016 for cancer.  She is up-to-date on Pap smear.  She received COVID-19 vaccine in April 2021.  She is due for HIV screening and Tdap.  She denies headache, blurry vision, shortness of breath, palpitation, leg swelling, nausea, vomiting, diarrhea, dizziness, decreased appetite, weight loss.  Her blood pressure is 138/84 today which she thinks is because of decreased sleep.  She does not have history of hypertension.  Past Medical History:  Diagnosis Date  . Breast nodule 12/08/2014  . Breast pain, left  12/08/2014  . Elevated BP 12/08/2014  . Trichimoniasis 12/16/2014    Past Surgical History:  Procedure Laterality Date  . TUBAL LIGATION      Family History  Problem Relation Age of Onset  . Hyperlipidemia Mother   . Hypertension Mother   . Diabetes Mother   . Hypertension Father   . Hyperlipidemia Father   . Heart attack Father   . Diabetes Sister   . Hypertension Sister   . Hyperlipidemia Sister     Social History   Socioeconomic History  . Marital status: Married    Spouse name: Not on file  . Number of children: 3  . Years of education: Not on file  . Highest education level: Not on file  Occupational History  . Not on file  Tobacco Use  . Smoking status: Never Smoker  . Smokeless tobacco: Never Used  Substance and Sexual Activity  . Alcohol use: No  . Drug use: No  . Sexual activity: Yes    Birth control/protection: Surgical    Comment: tubal  Other Topics Concern  . Not on file  Social History Narrative  . Not on file   Social Determinants of Health   Financial Resource Strain:   . Difficulty of Paying Living Expenses:   Food Insecurity:   . Worried About Charity fundraiser in the Last Year:   . Arboriculturist in the Last Year:   Transportation Needs:   . Film/video editor (Medical):   Marland Kitchen Lack of Transportation (Non-Medical):   Physical Activity:   .  Days of Exercise per Week:   . Minutes of Exercise per Session:   Stress:   . Feeling of Stress :   Social Connections:   . Frequency of Communication with Friends and Family:   . Frequency of Social Gatherings with Friends and Family:   . Attends Religious Services:   . Active Member of Clubs or Organizations:   . Attends Archivist Meetings:   Marland Kitchen Marital Status:   Intimate Partner Violence:   . Fear of Current or Ex-Partner:   . Emotionally Abused:   Marland Kitchen Physically Abused:   . Sexually Abused:     ROS Review of Systems  Constitutional: Positive for fatigue.  HENT: Negative.    Eyes: Negative.   Respiratory: Negative.   Cardiovascular: Positive for chest pain.  Gastrointestinal: Negative.   Endocrine: Negative.   Genitourinary: Negative.   Musculoskeletal: Negative.   Allergic/Immunologic: Negative.   Neurological: Negative.   Hematological: Negative.   Psychiatric/Behavioral: Negative.     Objective:   Today's Vitals: BP 136/84   Pulse 94   Temp 98.7 F (37.1 C) (Temporal)   Resp 15   Ht 5\' 5"  (1.651 m)   Wt 183 lb (83 kg)   LMP 07/14/2019   SpO2 99%   BMI 30.45 kg/m   Physical Exam Constitutional:      Appearance: Normal appearance. She is obese.  HENT:     Head: Normocephalic and atraumatic.     Nose: Nose normal.     Mouth/Throat:     Mouth: Mucous membranes are moist.  Eyes:     Extraocular Movements: Extraocular movements intact.     Conjunctiva/sclera: Conjunctivae normal.     Pupils: Pupils are equal, round, and reactive to light.  Cardiovascular:     Rate and Rhythm: Normal rate and regular rhythm.     Pulses: Normal pulses.     Heart sounds: Normal heart sounds.     Comments: Musculoskeletal tenderness positive on left side of upper chest. Pulmonary:     Effort: Pulmonary effort is normal.     Breath sounds: Normal breath sounds.  Abdominal:     General: Bowel sounds are normal.     Palpations: Abdomen is soft.  Musculoskeletal:        General: Normal range of motion.     Cervical back: Normal range of motion and neck supple.     Left lower leg: Left lower leg edema:    Skin:    Capillary Refill: Capillary refill takes less than 2 seconds.  Neurological:     General: No focal deficit present.     Mental Status: She is alert and oriented to person, place, and time.  Psychiatric:        Mood and Affect: Mood normal.        Behavior: Behavior normal.        Thought Content: Thought content normal.        Judgment: Judgment normal.     Musculoskeletal chest pain: -On left side.  Tenderness positive on  exam -Prescribed ibuprofen 600 mg every 8 hour with food as needed. -avoid heavy lifting -Follow-up in 1 week if symptoms persist  Encounter for screening mammogram: -Patient sent for screening mammogram -Reviewed previous mammogram from 2016 which came back negative for malignancy  Encounter for Tdap: Tdap given  Encounter for HIV screening: -Patient gave me consent for HIV screening.  Generalized fatigue: -Could be due to overexertion. -Reviewed labs such as CBC, CMP, TSH, lipid  panel all came back within normal limits -Discussed the labs with the patient and she verbalized understanding.  Check HIV we will  Prediabetes: Patient's A1c is 5.8% -Discussed about dietary modification, exercise and weight loss and increased risk for diabetes and she verbalized understanding. -Follow-up in 3 months.  Repeat A1c on next visit.  Encounter to establish care: Care established.  Assessment & Plan:   Problem List Items Addressed This Visit    None    Visit Diagnoses    Musculoskeletal chest pain    -  Primary   Encounter for screening mammogram for breast cancer       Relevant Orders   MM 3D SCREEN BREAST BILATERAL   Need for Tdap vaccination       Relevant Orders   Tdap vaccine greater than or equal to 7yo IM (Completed)   Encounter for screening for HIV       Relevant Orders   HIV Antibody (routine testing w rflx)   Fatigue, unspecified type       Relevant Orders   Vitamin D 1,25 dihydroxy   Prediabetes       Encounter to establish care          Outpatient Encounter Medications as of 08/05/2019  Medication Sig  . ibuprofen (ADVIL) 600 MG tablet Take 1 tablet (600 mg total) by mouth every 8 (eight) hours as needed for moderate pain.  . [DISCONTINUED] cetirizine (ZYRTEC ALLERGY) 10 MG tablet Take 1 tablet (10 mg total) by mouth daily. (Patient not taking: Reported on 08/05/2019)  . [DISCONTINUED] fluticasone (FLONASE) 50 MCG/ACT nasal spray Place 1 spray into both nostrils  daily for 14 days.   No facility-administered encounter medications on file as of 08/05/2019.    Follow-up: Return in about 3 months (around 11/05/2019).   Mckinley Jewel, MD

## 2019-08-05 NOTE — Patient Instructions (Addendum)
Give Tdap Check HIV & Vitamin D level Take Ibuprofen every 8 hours as needed with food for pain Avoid heavy lifting Encourged increase hydration Watch her diet/calories order screening mammogram Follow up in 1 week if symptoms persists otherwise in 3 months  Repeat A1c in 3 months

## 2019-08-07 MED ORDER — HALOBETASOL PROPIONATE 0.05 % EX CREA: TOPICAL_CREAM | Freq: Two times a day (BID) | CUTANEOUS | 0 refills | Status: AC | PRN

## 2019-08-07 NOTE — Telephone Encounter (Signed)
Patient called back told her per Dr. Denna Haggard we will call in a stonger prescription for her to use x 3 weeks and then if no better to call back and we will schedule the patch testing.

## 2019-08-07 NOTE — Addendum Note (Signed)
Addended by: Jacobo Moncrief, Joelene Millin S on: 08/07/2019 11:43 AM   Modules accepted: Orders

## 2019-08-07 NOTE — Telephone Encounter (Signed)
Left message with patients daughter. Patient will call us back when she gets off work.

## 2019-08-08 LAB — VITAMIN D 1,25 DIHYDROXY
Vitamin D 1, 25 (OH)2 Total: 43 pg/mL (ref 18–72)
Vitamin D2 1, 25 (OH)2: <8 pg/mL
Vitamin D3 1, 25 (OH)2: 43 pg/mL

## 2019-08-08 LAB — HIV ANTIBODY (ROUTINE TESTING W REFLEX): HIV 1&2 Ab, 4th Generation: NONREACTIVE

## 2019-08-28 ENCOUNTER — Telehealth: Payer: Self-pay

## 2019-08-28 ENCOUNTER — Ambulatory Visit
Admission: RE | Admit: 2019-08-28 | Discharge: 2019-08-28 | Disposition: A | Discharge status: Home / Self Care | Payer: 59 | Source: Ambulatory Visit | Attending: Internal Medicine | Admitting: Internal Medicine

## 2019-08-28 ENCOUNTER — Other Ambulatory Visit: Payer: Self-pay

## 2019-08-28 ENCOUNTER — Other Ambulatory Visit (HOSPITAL_COMMUNITY): Payer: Self-pay | Admitting: Internal Medicine

## 2019-08-28 ENCOUNTER — Other Ambulatory Visit: Payer: Self-pay | Admitting: Internal Medicine

## 2019-08-28 DIAGNOSIS — Z1231 Encounter for screening mammogram for malignant neoplasm of breast: Secondary | ICD-10-CM

## 2019-08-28 DIAGNOSIS — N644 Mastodynia: Secondary | ICD-10-CM

## 2019-08-28 NOTE — Telephone Encounter (Signed)
Last mammogram 2016  At [facility].  Pt went to the [facility] appt, however she is having pain in the Left Breast   She said the pain comes and goes, has been present for a couple of months  The mammo bus advised she needed to reach back out to the provider to get Diagnostic done.  Please advise --

## 2019-08-28 NOTE — Telephone Encounter (Signed)
What would you advise for pt ?

## 2019-09-14 ENCOUNTER — Ambulatory Visit: Payer: 59 | Admitting: Dermatology

## 2019-09-21 ENCOUNTER — Other Ambulatory Visit: Payer: Self-pay

## 2019-09-21 ENCOUNTER — Ambulatory Visit: Payer: 59

## 2019-09-21 ENCOUNTER — Ambulatory Visit
Admission: RE | Admit: 2019-09-21 | Discharge: 2019-09-21 | Disposition: A | Discharge status: Home / Self Care | Payer: 59 | Source: Ambulatory Visit | Attending: Internal Medicine | Admitting: Internal Medicine

## 2019-09-21 DIAGNOSIS — N644 Mastodynia: Secondary | ICD-10-CM

## 2019-11-17 ENCOUNTER — Other Ambulatory Visit: Payer: Self-pay

## 2019-11-17 ENCOUNTER — Encounter: Payer: Self-pay | Admitting: Dermatology

## 2019-11-17 ENCOUNTER — Ambulatory Visit (INDEPENDENT_AMBULATORY_CARE_PROVIDER_SITE_OTHER): Payer: 59 | Admitting: Dermatology

## 2019-11-17 DIAGNOSIS — L83 Acanthosis nigricans: Secondary | ICD-10-CM

## 2019-11-17 DIAGNOSIS — L309 Dermatitis, unspecified: Secondary | ICD-10-CM

## 2019-11-17 NOTE — Progress Notes (Signed)
TRANSLATOR WITH PATIENT

## 2019-12-07 ENCOUNTER — Other Ambulatory Visit: Payer: Self-pay

## 2019-12-07 ENCOUNTER — Ambulatory Visit (INDEPENDENT_AMBULATORY_CARE_PROVIDER_SITE_OTHER): Payer: 59 | Admitting: Internal Medicine

## 2019-12-07 ENCOUNTER — Encounter: Payer: Self-pay | Admitting: Internal Medicine

## 2019-12-07 VITALS — BP 138/89 | HR 92 | Temp 97.9°F | Resp 18 | Ht 63.0 in | Wt 181.4 lb

## 2019-12-07 DIAGNOSIS — M549 Dorsalgia, unspecified: Secondary | ICD-10-CM

## 2019-12-07 DIAGNOSIS — R7303 Prediabetes: Secondary | ICD-10-CM

## 2019-12-07 DIAGNOSIS — I10 Essential (primary) hypertension: Secondary | ICD-10-CM | POA: Diagnosis not present

## 2019-12-07 DIAGNOSIS — N644 Mastodynia: Secondary | ICD-10-CM

## 2019-12-07 DIAGNOSIS — E669 Obesity, unspecified: Secondary | ICD-10-CM

## 2019-12-07 DIAGNOSIS — M546 Pain in thoracic spine: Secondary | ICD-10-CM

## 2019-12-07 DIAGNOSIS — G8929 Other chronic pain: Secondary | ICD-10-CM

## 2019-12-07 NOTE — Assessment & Plan Note (Signed)
Chronic, likely related to her work, repetitive movement Advised light exercise, material provided Tylenol PRN for pain Can apply heating pads

## 2019-12-07 NOTE — Progress Notes (Signed)
Established Patient Office Visit  Subjective:  Patient ID: Julie Butler, female    DOB: 16-Aug-1975  Age: 44 y.o. MRN: [id number]  CC:  Chief Complaint  Patient presents with  . Follow-up    former pahwani pt she was having pain in her breast and she was unable to get mammo do to her father passing away. Will reset this up for her today. has had back pain for about 3 months around her bra area takes tylenol and ibuprofen this helps sometimes.    HPI Julie Butler is a 44 year old female with PMH of prediabetes and obesity presents for follow up of her chronic conditions and c/o left sided breast pain for few months. History is obtained with the help of the interpreter in the room. She says that she has localized left breast pain, localizes to anterolateral side of nipple, intermittent, with no association to menstrual cycles. She mentions that she has noticed bump in that area, but no redness or discharge from the area or nipple. She denies fever or chills. She denies any personal or family history of breast cancer. She was scheduled to get Mammography in the last visit, but was not able to get it done due to her personal reasons.  She also c/o chronic upper back pain, which is worse with activity and better with Tylenol. She denies numbness, weakness or tingling in the upper extremities.  She states that she has been trying to lose weight by watching diet, but it has been difficult recently due to her family issues. She wants to follow low-carbohydrate diet for now to help with her blood pressure and blood glucose.  Past Medical History:  Diagnosis Date  . Breast nodule 12/08/2014  . Breast pain, left 12/08/2014  . Elevated BP 12/08/2014  . Trichimoniasis 12/16/2014    Past Surgical History:  Procedure Laterality Date  . TUBAL LIGATION      Family History  Problem Relation Age of Onset  . Hyperlipidemia Mother   . Hypertension Mother   . Diabetes Mother   .  Hypertension Father   . Hyperlipidemia Father   . Heart attack Father   . Diabetes Sister   . Hypertension Sister   . Hyperlipidemia Sister     Social History   Socioeconomic History  . Marital status: Married    Spouse name: Not on file  . Number of children: 3  . Years of education: Not on file  . Highest education level: Not on file  Occupational History  . Not on file  Tobacco Use  . Smoking status: Never Smoker  . Smokeless tobacco: Never Used  Vaping Use  . Vaping Use: Never used  Substance and Sexual Activity  . Alcohol use: No  . Drug use: No  . Sexual activity: Yes    Birth control/protection: Surgical    Comment: tubal  Other Topics Concern  . Not on file  Social History Narrative  . Not on file   Social Determinants of Health   Financial Resource Strain:   . Difficulty of Paying Living Expenses: Not on file  Food Insecurity:   . Worried About Charity fundraiser in the Last Year: Not on file  . Ran Out of Food in the Last Year: Not on file  Transportation Needs:   . Lack of Transportation (Medical): Not on file  . Lack of Transportation (Non-Medical): Not on file  Physical Activity:   . Days of Exercise per Week: Not on file  .  Minutes of Exercise per Session: Not on file  Stress:   . Feeling of Stress : Not on file  Social Connections:   . Frequency of Communication with Friends and Family: Not on file  . Frequency of Social Gatherings with Friends and Family: Not on file  . Attends Religious Services: Not on file  . Active Member of Clubs or Organizations: Not on file  . Attends Archivist Meetings: Not on file  . Marital Status: Not on file  Intimate Partner Violence:   . Fear of Current or Ex-Partner: Not on file  . Emotionally Abused: Not on file  . Physically Abused: Not on file  . Sexually Abused: Not on file    Outpatient Medications Prior to Visit  Medication Sig Dispense Refill  . ibuprofen (ADVIL) 600 MG tablet Take 1  tablet (600 mg total) by mouth every 8 (eight) hours as needed for moderate pain. 30 tablet 0  . triamcinolone cream (KENALOG) 0.5 % Apply 1 application topically 3 (three) times daily. (Patient not taking: Reported on 12/07/2019)     No facility-administered medications prior to visit.    No Known Allergies  ROS Review of Systems  Constitutional: Negative for chills and fever.  HENT: Negative for congestion, sinus pressure, sinus pain and sore throat.   Eyes: Negative for pain and discharge.  Respiratory: Negative for cough and shortness of breath.   Cardiovascular: Negative for chest pain and palpitations.  Gastrointestinal: Negative for abdominal pain, constipation, diarrhea, nausea and vomiting.  Endocrine: Negative for polydipsia and polyuria.  Genitourinary: Negative for dysuria and hematuria.  Musculoskeletal: Positive for back pain. Negative for neck pain and neck stiffness.  Skin: Negative for rash.  Neurological: Negative for dizziness and weakness.  Psychiatric/Behavioral: Negative for agitation and behavioral problems.  Breast: Left breast pain and bump    Objective:    Physical Exam Vitals reviewed.  Constitutional:      General: She is not in acute distress.    Appearance: She is not diaphoretic.  HENT:     Head: Normocephalic and atraumatic.     Nose: Nose normal. No congestion.     Mouth/Throat:     Mouth: Mucous membranes are moist.     Pharynx: No posterior oropharyngeal erythema.  Eyes:     General: No scleral icterus.    Extraocular Movements: Extraocular movements intact.     Pupils: Pupils are equal, round, and reactive to light.  Cardiovascular:     Rate and Rhythm: Normal rate and regular rhythm.     Heart sounds: No murmur heard.   Pulmonary:     Breath sounds: Normal breath sounds. No wheezing or rales.  Chest:     Breasts:        Right: No bleeding or tenderness.        Left: No bleeding or tenderness.     Comments: No mass appreciated on  left breast, no axillary LAD Abdominal:     Palpations: Abdomen is soft.     Tenderness: There is no abdominal tenderness.  Musculoskeletal:        General: No swelling.     Cervical back: Neck supple. No tenderness.     Right lower leg: No edema.     Left lower leg: No edema.  Skin:    General: Skin is warm.     Findings: No rash.  Neurological:     General: No focal deficit present.     Mental Status: She is alert  and oriented to person, place, and time.  Psychiatric:        Mood and Affect: Mood normal.        Behavior: Behavior normal.     BP 138/89 (BP Location: Right Arm, Patient Position: Sitting, Cuff Size: Normal)   Pulse 92   Temp 97.9 F (36.6 C) (Temporal)   Resp 18   Ht 5\' 3"  (1.6 m)   Wt 181 lb 6.4 oz (82.3 kg)   SpO2 99%   BMI 32.13 kg/m  Wt Readings from Last 3 Encounters:  12/07/19 181 lb 6.4 oz (82.3 kg)  08/05/19 183 lb (83 kg)  02/03/19 178 lb 9.6 oz (81 kg)     Health Maintenance Due  Topic Date Due  . Hepatitis C Screening  Never done    There are no preventive care reminders to display for this patient.  Lab Results  Component Value Date   TSH 1.300 07/16/2019   Lab Results  Component Value Date   WBC 6.5 07/16/2019   HGB 12.5 07/16/2019   HCT 38.0 07/16/2019   MCV 80 07/16/2019   PLT 316 07/16/2019   Lab Results  Component Value Date   NA 139 07/16/2019   K 4.3 07/16/2019   CO2 23 07/16/2019   GLUCOSE 100 (H) 07/16/2019   BUN 9 07/16/2019   CREATININE 0.60 07/16/2019   BILITOT 0.4 07/16/2019   ALKPHOS 88 07/16/2019   AST 20 07/16/2019   ALT 17 07/16/2019   PROT 6.9 07/16/2019   ALBUMIN 4.2 07/16/2019   CALCIUM 9.0 07/16/2019   Lab Results  Component Value Date   CHOL 150 07/16/2019   Lab Results  Component Value Date   HDL 49 07/16/2019   Lab Results  Component Value Date   LDLCALC 85 07/16/2019   Lab Results  Component Value Date   TRIG 85 07/16/2019   Lab Results  Component Value Date   CHOLHDL 3.1  07/16/2019   Lab Results  Component Value Date   HGBA1C 5.8 (H) 07/16/2019      Assessment & Plan:   Breast pain, left Has been present for months, intermittent, variable in intensity No relationship with menstrual cycles No fever, chills, local erythema, swelling or discharge from neeple Will obtain Mammography  Hypertension BP: 138/89 Patient prefers to start with lifestyle modification before starting medication. Advised DASH diet, material provided Advised light exercise, at least 150 mins/week  Prediabetes HbA1C: 5.8 in 07/2019 Has been trying to lose weight and diet modification Will check CMP and HbA1C in next visit Advised to follow DASH diet mainly for HTN  Obesity (BMI 30.0-34.9) 2 lbs weight loss since last visit Diet modification and moderate exercise as tolerated  Back pain Chronic, likely related to her work, repetitive movement Advised light exercise, material provided Tylenol PRN for pain Can apply heating pads    No orders of the defined types were placed in this encounter.   Follow-up: Return in about 3 months (around 03/08/2020).    Lindell Spar, MD

## 2019-12-07 NOTE — Assessment & Plan Note (Signed)
BP: 138/89 Patient prefers to start with lifestyle modification before starting medication. Advised DASH diet, material provided Advised light exercise, at least 150 mins/week

## 2019-12-07 NOTE — Patient Instructions (Addendum)
You are advised to get Mammography as scheduled.  Please follow low-carbohydrate, low-cholesterol diet.  DASH diet can be helpful for controlling BP as well.  DASH stands for Dietary Approaches to Stop Hypertension. The DASH diet is a healthy-eating plan designed to help treat or prevent high blood pressure (hypertension).  The DASH diet includes foods that are rich in potassium, calcium and magnesium. These nutrients help control blood pressure. The diet limits foods that are high in sodium, saturated fat and added sugars.  Studies have shown that the DASH diet can lower blood pressure in as little as two weeks. The diet can also lower low-density lipoprotein (LDL or "bad") cholesterol levels in the blood. High blood pressure and high LDL cholesterol levels are two major risk factors for heart disease and stroke.    DASH diet: Recommended servings The DASH diet provides daily and weekly nutritional goals. The number of servings you should have depends on your daily calorie needs.  Here's a look at the recommended servings from each food group for a 2,000-calorie-a-day DASH diet:  Grains: 6 to 8 servings a day. One serving is one slice bread, 1 ounce dry cereal, or 1/2 cup cooked cereal, rice or pasta. Vegetables: 4 to 5 servings a day. One serving is 1 cup raw leafy green vegetable, 1/2 cup cut-up raw or cooked vegetables, or 1/2 cup vegetable juice. Fruits: 4 to 5 servings a day. One serving is one medium fruit, 1/2 cup fresh, frozen or canned fruit, or 1/2 cup fruit juice. Fat-free or low-fat dairy products: 2 to 3 servings a day. One serving is 1 cup milk or yogurt, or 1 1/2 ounces cheese. Lean meats, poultry and fish: six 1-ounce servings or fewer a day. One serving is 1 ounce cooked meat, poultry or fish, or 1 egg. Nuts, seeds and legumes: 4 to 5 servings a week. One serving is 1/3 cup nuts, 2 tablespoons peanut butter, 2 tablespoons seeds, or 1/2 cup cooked legumes (dried beans or  peas). Fats and oils: 2 to 3 servings a day. One serving is 1 teaspoon soft margarine, 1 teaspoon vegetable oil, 1 tablespoon mayonnaise or 2 tablespoons salad dressing. Sweets and added sugars: 5 servings or fewer a week. One serving is 1 tablespoon sugar, jelly or jam, 1/2 cup sorbet, or 1 cup lemonade.    Please perform simple neck and back exercises as discussed. Okay to take Tylenol/Ibuprofen as needed for pain/swelling of the back.  [email or web address]   Dolor a la palpacin de las mamas Breast Tenderness El dolor a la palpacin de las mamas es un problema frecuente en las mujeres de todas las edades, pero tambin puede ocurrir en los hombres. El dolor a la palpacin de las mamas puede oscilar desde molestias leves hasta un dolor intenso. En las mujeres, el dolor generalmente es intermitente y se relaciona con el ciclo menstrual, pero puede ser constante. [employer] las causas posibles del dolor a la palpacin de las [place], incluidos los cambios hormonales, infecciones y el uso de ciertos medicamentos. Puede someterse a pruebas como una mamografa o una ecografa, para Copywriter, advertising inusuales. Por lo general, el dolor a la palpacin de las mamas no significa que usted Primary school teacher de mama. Siga estas instrucciones en su casa: Control del dolor y de las molestias   Si se lo indican, aplique hielo sobre la zona dolorida. Para hacer esto: ? Ponga el hielo en una bolsa plstica. ? Coloque una [employer] piel y Therapist, nutritional. ? Coloque  el hielo durante 72minutos, 2 a 3veces por Training and development officer.  Use un sostn de soporte, especialmente al hacer actividad fsica. Quizs tambin desee usar ese sostn para dormir, si siente las [employer]. Medicamentos  Use los medicamentos de venta libre y los recetados solamente como se lo haya indicado el mdico. Si el dolor es causado por alguna infeccin, posiblemente le receten un antibitico.  Si le recetaron un  antibitico, tmelo o selo como se lo haya indicado el mdico. No deje de tomar los antibiticos aunque comience a Sports administrator. Comida y bebida  El mdico puede recomendarle disminuir el consumo de grasa en su dieta. Puede hacer lo siguiente: ? Limite el consumo de alimentos fritos. ? A la hora de cocinarlos, opte por hornearlos, hervirlos, grillarlos y asarlos a Administrator, arts.  Disminuya la cantidad de cafena de su dieta. En cambio, beba ms agua y elija bebidas sin cafena. Instrucciones generales   Lleve un registro de los das y las horas cuando tiene mayor sensibilidad en las [place].  Pregntele al mdico cmo debe hacerse los exmenes de mamas en su casa. Esto la ayudar a notar si tiene algn crecimiento o bulto fuera de lo normal.  Concurra a todas las visitas de seguimiento como se lo haya indicado el mdico. Esto es importante. Comunquese con un mdico si:  Cualquier zona de la mama est dura, enrojecida y caliente al tacto. Puede ser un signo de infeccin.  Es Art therapist y: ? No est en etapa de lactancia y [employer] lquido de los pezones, especialmente sangre o pus. ? Tiene un bulto nuevo o doloroso en la mama que no desaparece despus de la finalizacin del perodo menstrual.  Tiene fiebre.  El dolor no mejora o [place].  El Airline pilot impide realizar las actividades cotidianas. Resumen  El dolor a la palpacin de las mamas puede oscilar desde molestias leves hasta un dolor intenso.  Son [employer] las causas posibles del dolor a la palpacin de las [place], incluidos los cambios hormonales, infecciones y el uso de ciertos medicamentos.  Puede tratarse con hielo, el uso un sostn de soporte y [employer].  Haga cambios en la dieta como se lo haya indicado el mdico. Esta informacin no tiene Marine scientist el consejo del mdico. Asegrese de hacerle al mdico cualquier pregunta que tenga. Document Revised: 08/27/2018 Document Reviewed: 08/27/2018 Elsevier Patient  Education  [place].

## 2019-12-07 NOTE — Assessment & Plan Note (Signed)
HbA1C: 5.8 in 07/2019 Has been trying to lose weight and diet modification Will check CMP and HbA1C in next visit Advised to follow DASH diet mainly for HTN

## 2019-12-07 NOTE — Assessment & Plan Note (Signed)
Has been present for months, intermittent, variable in intensity No relationship with menstrual cycles No fever, chills, local erythema, swelling or discharge from neeple Will obtain Mammography

## 2019-12-07 NOTE — Assessment & Plan Note (Signed)
2 lbs weight loss since last visit Diet modification and moderate exercise as tolerated

## 2019-12-14 ENCOUNTER — Encounter: Payer: Self-pay | Admitting: Dermatology

## 2019-12-14 NOTE — Progress Notes (Signed)
   Follow-Up Visit   Subjective  Julie Butler is a 44 y.o. female who presents for the following: Follow-up (DERMATITIS MEDICATION TAC HELPS A LITTLE, ALSO PATIENT WANTS TO KNOW IF SHE CAN USE IT FOR THE SPOTS ON HER FACE ALSO).  Rash Location: Neck Duration:  Quality:  Associated Signs/Symptoms: Modifying Factors: Triamcinolone helps but does not clear her. Severity:  Timing: Context:   Objective  Well appearing patient in no apparent distress; mood and affect are within normal limits.  A focused examination was performed including Head, neck, upper torso.. Relevant physical exam findings are noted in the Assessment and Plan.   Assessment & Plan    Dermatitis (2) Neck - Anterior; Neck - Posterior  May use the topical triamcinolone as needed flare, but this does not belong on face             Acanthosis nigricans Neck - Posterior  Told through the interpreter that there is really no FDA approved treatment or excellent therapy for this.  Encouraged to adopt a healthy lifestyle and check regularly for diabetes.      I, Lavonna Monarch, MD, have reviewed all documentation for this visit.  The documentation on 12/14/19 for the exam, diagnosis, procedures, and orders are all accurate and complete.

## 2019-12-29 ENCOUNTER — Ambulatory Visit (HOSPITAL_COMMUNITY): Admission: RE | Admit: 2019-12-29 | Payer: 59 | Source: Ambulatory Visit

## 2019-12-29 ENCOUNTER — Encounter (HOSPITAL_COMMUNITY): Payer: 59

## 2019-12-31 ENCOUNTER — Other Ambulatory Visit (HOSPITAL_COMMUNITY): Payer: Self-pay | Admitting: Internal Medicine

## 2019-12-31 DIAGNOSIS — N644 Mastodynia: Secondary | ICD-10-CM

## 2020-01-21 ENCOUNTER — Ambulatory Visit (HOSPITAL_COMMUNITY)
Admission: RE | Admit: 2020-01-21 | Discharge: 2020-01-21 | Disposition: A | Discharge status: Home / Self Care | Payer: 59 | Source: Ambulatory Visit | Attending: Internal Medicine | Admitting: Internal Medicine

## 2020-01-21 ENCOUNTER — Other Ambulatory Visit: Payer: Self-pay

## 2020-01-21 DIAGNOSIS — N644 Mastodynia: Secondary | ICD-10-CM

## 2020-02-08 ENCOUNTER — Ambulatory Visit (INDEPENDENT_AMBULATORY_CARE_PROVIDER_SITE_OTHER): Payer: 59 | Admitting: Dermatology

## 2020-02-08 ENCOUNTER — Other Ambulatory Visit: Payer: Self-pay

## 2020-02-08 DIAGNOSIS — L820 Inflamed seborrheic keratosis: Secondary | ICD-10-CM | POA: Diagnosis not present

## 2020-02-08 DIAGNOSIS — D485 Neoplasm of uncertain behavior of skin: Secondary | ICD-10-CM

## 2020-02-08 DIAGNOSIS — L821 Other seborrheic keratosis: Secondary | ICD-10-CM

## 2020-02-08 NOTE — Patient Instructions (Signed)

## 2020-02-09 ENCOUNTER — Encounter (HOSPITAL_COMMUNITY): Payer: 59

## 2020-02-13 ENCOUNTER — Encounter: Payer: Self-pay | Admitting: Dermatology

## 2020-02-13 NOTE — Progress Notes (Addendum)
   Follow-Up Visit   Subjective  Julie Butler is a 44 y.o. female who presents for the following: Follow-up (On dermatitis got some triamcinolone cream for neck last visit. Patient says we were going to do somthing about red spot on face. Also has something on chest she wants checked. ).  Interpreter with patient throughout visit.  Several spots on face are stated to have grown or irritated. Location:  Duration:  Quality:  Associated Signs/Symptoms: Modifying Factors:  Severity:  Timing: Context:   Objective  Well appearing patient in no apparent distress; mood and affect are within normal limits. Objective  Right Malar Cheek: Pearly 4 mm papule       Objective  Left Malar Cheek (5): For 2 mm slightly inflamed textured brown papules.  Patient requests treatment.  Told of risks treatment failure scar.   A focused examination was performed including Head and neck.. Relevant physical exam findings are noted in the Assessment and Plan.   Assessment & Plan    Neoplasm of uncertain behavior of skin Right Malar Cheek  Skin / nail biopsy Type of biopsy: tangential   Informed consent: discussed and consent obtained   Timeout: patient name, date of birth, surgical site, and procedure verified   Procedure prep:  Patient was prepped and draped in usual sterile fashion (Non sterile) Prep type:  Chlorhexidine Anesthesia: the lesion was anesthetized in a standard fashion   Anesthetic:  1% lidocaine w/ epinephrine 1-100,000 local infiltration Instrument used: flexible razor blade   Hemostasis achieved with: ferric subsulfate and electrodesiccation   Outcome: patient tolerated procedure well   Post-procedure details: sterile dressing applied and wound care instructions given   Dressing type: bandage and petrolatum    Specimen 1 - Surgical pathology Differential Diagnosis: R/O BCC Check Margins: No Cautery after biopsy  Total preop with interpreter risks of scar,  regrowth, infection, discoloration.  Inflamed seborrheic keratosis (5) Left Malar Cheek  Electrocautery: Low power two second cautery without curettage.     I, Lavonna Monarch, MD, have reviewed all documentation for this visit.  The documentation on 02/17/20 for the exam, diagnosis, procedures, and orders are all accurate and complete.

## 2020-03-08 ENCOUNTER — Ambulatory Visit: Payer: 59 | Admitting: Internal Medicine

## 2020-03-08 ENCOUNTER — Other Ambulatory Visit: Payer: Self-pay

## 2020-03-14 ENCOUNTER — Ambulatory Visit (INDEPENDENT_AMBULATORY_CARE_PROVIDER_SITE_OTHER): Payer: 59 | Admitting: Internal Medicine

## 2020-03-14 ENCOUNTER — Encounter: Payer: Self-pay | Admitting: Internal Medicine

## 2020-03-14 ENCOUNTER — Other Ambulatory Visit: Payer: Self-pay

## 2020-03-14 VITALS — BP 144/92 | HR 78 | Resp 16 | Ht 63.0 in | Wt 182.0 lb

## 2020-03-14 DIAGNOSIS — Z23 Encounter for immunization: Secondary | ICD-10-CM

## 2020-03-14 DIAGNOSIS — I10 Essential (primary) hypertension: Secondary | ICD-10-CM

## 2020-03-14 DIAGNOSIS — R7303 Prediabetes: Secondary | ICD-10-CM

## 2020-03-14 DIAGNOSIS — M542 Cervicalgia: Secondary | ICD-10-CM

## 2020-03-14 DIAGNOSIS — M545 Low back pain, unspecified: Secondary | ICD-10-CM

## 2020-03-14 DIAGNOSIS — G8929 Other chronic pain: Secondary | ICD-10-CM

## 2020-03-14 MED ORDER — CYCLOBENZAPRINE HCL 5 MG PO TABS: 5.0000 mg | ORAL_TABLET | Freq: Three times a day (TID) | ORAL | 1 refills | Status: DC | PRN

## 2020-03-14 NOTE — Assessment & Plan Note (Signed)
HbA1C: 5.8 in 07/2019 Has been trying to lose weight and diet modification Will check CMP and HbA1C before next visit Advised to follow DASH diet mainly for HTN

## 2020-03-14 NOTE — Assessment & Plan Note (Signed)
Muscle strain, likely related to her work Advised to avoid heavy weight Trial of back brace Tylenol or Ibuprofen PRN Flexeril PRN

## 2020-03-14 NOTE — Assessment & Plan Note (Signed)
BP Readings from Last 1 Encounters:  03/14/20 (!) 144/92   Could be elevated due to pain Not on any medications, discussed about starting Amlodipine, patient does not want to start for now. Agrees to follow strict low salt diet. Advised DASH diet and moderate exercise/walking, at least 150 mins/week

## 2020-03-14 NOTE — Patient Instructions (Signed)
Please take Tylenol or Ibuprofen as needed up to 3 times in a day for neck and back pain.  Please take Cyclobenzaprine as prescribed for muscle spasms.  Okay to apply heating pads or pain relieving creams locally for pain/swelling.  Please follow low carbohydrate and low sodium diet and perform moderate exercise/walking at least 150 mins/week.

## 2020-03-14 NOTE — Progress Notes (Signed)
Established Patient Office Visit  Subjective:  Patient ID: Julie Butler, female    DOB: Aug 24, 1975  Age: 45 y.o. MRN: [id number]  CC:  Chief Complaint  Patient presents with  . Back Pain  . Shoulder Pain    Both shoulders have been hurting. Pain comes and goes, but on 03/08/20 pain was so intense it was burning    HPI Julie Butler  is a 45 year old female with PMH of prediabetes and obesity presents for follow up of her chronic conditions and shoulder pain.  She states that she has intermittent shoulder pain/burning b/l, which is associated with neck pain. She also has chronic low back pain. She has been taking Ibuprofen as needed and states that it helps with the pain. She denies any numbness, weakness or tingling in the UE or LE. 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 Denies any acute injury.  Her BP was 144/92 in the office today. She attributes it to little to no exercises and diet changes due to her recent vacation. She denies any headache, dizziness, chest pain, dyspnea or palpitations.  Past Medical History:  Diagnosis Date  . Breast nodule 12/08/2014  . Breast pain, left 12/08/2014  . Elevated BP 12/08/2014  . Trichimoniasis 12/16/2014  . Trichimoniasis 12/16/2014    Past Surgical History:  Procedure Laterality Date  . TUBAL LIGATION      Family History  Problem Relation Age of Onset  . Hyperlipidemia Mother   . Hypertension Mother   . Diabetes Mother   . Hypertension Father   . Hyperlipidemia Father   . Heart attack Father   . Diabetes Sister   . Hypertension Sister   . Hyperlipidemia Sister     Social History   Socioeconomic History  . Marital status: Married    Spouse name: Not on file  . Number of children: 3  . Years of education: Not on file  . Highest education level: Not on file  Occupational History  . Not on file  Tobacco Use  . Smoking status: Never Smoker  . Smokeless  tobacco: Never Used  Vaping Use  . Vaping Use: Never used  Substance and Sexual Activity  . Alcohol use: No  . Drug use: No  . Sexual activity: Yes    Birth control/protection: Surgical    Comment: tubal  Other Topics Concern  . Not on file  Social History Narrative  . Not on file   Social Determinants of Health   Financial Resource Strain: Not on file  Food Insecurity: Not on file  Transportation Needs: Not on file  Physical Activity: Not on file  Stress: Not on file  Social Connections: Not on file  Intimate Partner Violence: Not on file    Outpatient Medications Prior to Visit  Medication Sig Dispense Refill  . ibuprofen (ADVIL) 600 MG tablet Take 1 tablet (600 mg total) by mouth every 8 (eight) hours as needed for moderate pain. 30 tablet 0   No facility-administered medications prior to visit.    No Known Allergies  ROS Review of Systems  Constitutional: Negative for chills and fever.  HENT: Negative for congestion, sinus pressure, sinus pain and sore throat.   Eyes: Negative for pain and discharge.  Respiratory: Negative for cough and shortness of breath.   Cardiovascular: Negative for chest pain and palpitations.  Gastrointestinal: Negative for abdominal pain, constipation, diarrhea, nausea and vomiting.  Endocrine: Negative for polydipsia and polyuria.  Genitourinary: Negative for dysuria and hematuria.  Musculoskeletal: Positive for back pain and neck pain. Negative for neck stiffness.  Skin: Negative for rash.  Neurological: Negative for dizziness and weakness.  Psychiatric/Behavioral: Negative for agitation and behavioral problems.      Objective:    Physical Exam Vitals reviewed.  Constitutional:      General: She is not in acute distress.    Appearance: She is not diaphoretic.  HENT:     Head: Normocephalic and atraumatic.     Nose: Nose normal. No congestion.     Mouth/Throat:     Mouth: Mucous membranes are moist.     Pharynx: No posterior  oropharyngeal erythema.  Eyes:     General: No scleral icterus.    Extraocular Movements: Extraocular movements intact.     Pupils: Pupils are equal, round, and reactive to light.  Cardiovascular:     Rate and Rhythm: Normal rate and regular rhythm.     Pulses: Normal pulses.     Heart sounds: Normal heart sounds. No murmur heard.   Pulmonary:     Breath sounds: Normal breath sounds. No wheezing or rales.  Chest:  Breasts:     Right: No bleeding or tenderness.     Left: No bleeding or tenderness.      Comments: No mass appreciated on left breast, no axillary LAD Musculoskeletal:        General: No swelling.     Cervical back: Neck supple. No tenderness.     Right lower leg: No edema.     Left lower leg: No edema.  Skin:    General: Skin is warm.     Findings: No rash.  Neurological:     General: No focal deficit present.     Mental Status: She is alert and oriented to person, place, and time.     Sensory: No sensory deficit.     Motor: No weakness.  Psychiatric:        Mood and Affect: Mood normal.        Behavior: Behavior normal.     BP (!) 144/92   Pulse 78   Resp 16   Ht '5\' 3"'  (1.6 m)   Wt 182 lb 0.6 oz (82.6 kg)   SpO2 99%   BMI 32.25 kg/m  Wt Readings from Last 3 Encounters:  03/14/20 182 lb 0.6 oz (82.6 kg)  12/07/19 181 lb 6.4 oz (82.3 kg)  08/05/19 183 lb (83 kg)     Health Maintenance Due  Topic Date Due  . Hepatitis C Screening  Never done  . COVID-19 Vaccine (2 - Moderna risk 4-dose series) 07/06/2019  . COLONOSCOPY (Pts 45-61yr Insurance coverage will need to be confirmed)  Never done    There are no preventive care reminders to display for this patient.  Lab Results  Component Value Date   TSH 1.300 07/16/2019   Lab Results  Component Value Date   WBC 6.5 07/16/2019   HGB 12.5 07/16/2019   HCT 38.0 07/16/2019   MCV 80 07/16/2019   PLT 316 07/16/2019   Lab Results  Component Value Date   NA 139 07/16/2019   K 4.3 07/16/2019    CO2 23 07/16/2019   GLUCOSE 100 (H) 07/16/2019   BUN 9 07/16/2019   CREATININE 0.60 07/16/2019   BILITOT 0.4 07/16/2019   ALKPHOS 88 07/16/2019   AST 20 07/16/2019   ALT 17 07/16/2019   PROT 6.9 07/16/2019   ALBUMIN 4.2 07/16/2019  CALCIUM 9.0 07/16/2019   Lab Results  Component Value Date   CHOL 150 07/16/2019   Lab Results  Component Value Date   HDL 49 07/16/2019   Lab Results  Component Value Date   LDLCALC 85 07/16/2019   Lab Results  Component Value Date   TRIG 85 07/16/2019   Lab Results  Component Value Date   CHOLHDL 3.1 07/16/2019   Lab Results  Component Value Date   HGBA1C 5.8 (H) 07/16/2019      Assessment & Plan:   Problem List Items Addressed This Visit      Cardiovascular and Mediastinum   Hypertension    BP Readings from Last 1 Encounters:  03/14/20 (!) 144/92   Could be elevated due to pain Not on any medications, discussed about starting Amlodipine, patient does not want to start for now. Agrees to follow strict low salt diet. Advised DASH diet and moderate exercise/walking, at least 150 mins/week       Relevant Orders   CBC with Differential   TSH     Other   Prediabetes    HbA1C: 5.8 in 07/2019 Has been trying to lose weight and diet modification Will check CMP and HbA1C before next visit Advised to follow DASH diet mainly for HTN      Relevant Orders   CMP14+EGFR   Hemoglobin A1c   Lipid panel   Chronic midline low back pain    Muscle strain, likely related to her work Advised to avoid heavy weight Trial of back brace Tylenol or Ibuprofen PRN Flexeril PRN      Relevant Medications   cyclobenzaprine (FLEXERIL) 5 MG tablet   Other Relevant Orders   Vitamin D (25 hydroxy)   Neck pain - Primary    C/o intermittent neck pain associated with burning sensation in b/l shoulders and arms Check X-ray Cervical spine Tylenol or Ibuprofen PRN for now Simple neck exercises      Relevant Medications   cyclobenzaprine  (FLEXERIL) 5 MG tablet   Other Relevant Orders   DG Cervical Spine Complete      Meds ordered this encounter  Medications  . cyclobenzaprine (FLEXERIL) 5 MG tablet    Sig: Take 1 tablet (5 mg total) by mouth 3 (three) times daily as needed for muscle spasms.    Dispense:  30 tablet    Refill:  1    Follow-up: Return in about 3 months (around 06/12/2020) for Annual physical.    Lindell Spar, MD

## 2020-03-14 NOTE — Assessment & Plan Note (Addendum)
C/o intermittent neck pain associated with burning sensation in b/l shoulders and arms Check X-ray Cervical spine Tylenol or Ibuprofen PRN for now Simple neck exercises

## 2020-03-19 ENCOUNTER — Other Ambulatory Visit: Payer: Self-pay

## 2020-03-19 DIAGNOSIS — Z20822 Contact with and (suspected) exposure to covid-19: Secondary | ICD-10-CM

## 2020-03-21 ENCOUNTER — Encounter: Payer: Self-pay | Admitting: Internal Medicine

## 2020-03-21 ENCOUNTER — Telehealth (INDEPENDENT_AMBULATORY_CARE_PROVIDER_SITE_OTHER): Payer: Self-pay | Admitting: Internal Medicine

## 2020-03-21 ENCOUNTER — Other Ambulatory Visit: Payer: Self-pay

## 2020-03-21 VITALS — BP 162/112 | Ht 63.0 in | Wt 183.0 lb

## 2020-03-21 DIAGNOSIS — Z20822 Contact with and (suspected) exposure to covid-19: Secondary | ICD-10-CM

## 2020-03-21 DIAGNOSIS — I10 Essential (primary) hypertension: Secondary | ICD-10-CM

## 2020-03-21 MED ORDER — AMLODIPINE BESYLATE 5 MG PO TABS: 5.0000 mg | ORAL_TABLET | Freq: Every day | ORAL | 1 refills | Status: DC

## 2020-03-21 MED ORDER — ALBUTEROL SULFATE HFA 108 (90 BASE) MCG/ACT IN AERS: 2.0000 | INHALATION_SPRAY | Freq: Four times a day (QID) | RESPIRATORY_TRACT | 0 refills | Status: DC | PRN

## 2020-03-21 NOTE — Progress Notes (Signed)
Virtual Visit via Telephone Note   This visit type was conducted due to national recommendations for restrictions regarding the COVID-19 Pandemic (e.g. social distancing) in an effort to limit this patient's exposure and mitigate transmission in our community.  Due to her co-morbid illnesses, this patient is at least at moderate risk for complications without adequate follow up.  This format is felt to be most appropriate for this patient at this time.  The patient did not have access to video technology/had technical difficulties with video requiring transitioning to audio format only (telephone).  All issues noted in this document were discussed and addressed.  No physical exam could be performed with this format.  Evaluation Performed:  Follow-up visit  Date:  03/21/2020   ID:  Julie Butler, DOB February 27, 1976, MRN [id number]  Patient Location: Home Provider Location: Home Office  Participants: Patient Location of Patient: Home Location of Provider: Telehealth Consent was obtain for visit to be over via telehealth. I verified that I am speaking with the correct person using two identifiers.  PCP:  Lindell Spar, MD   Chief Complaint:  Cough and headache  History of Present Illness:    Julie Butler is a 45 y.o. female who has a televisit for c/o cough and headache since getting flu vaccine in the last visit.  She denies any fever, chills or myalgias currently. She had an episode of shortness of breath yesterday, but denies any chest pain, palpitations or wheezing. She checked her blood pressure today, and was noted to be more than 150/100 on 2 different occasions. Of note, she took some OTC cough medicines, but does not recall name of it. She did get tested for COVID and is awaiting results.  The patient does have symptoms concerning for COVID-19 infection (fever, chills, cough, or new shortness of breath).   Past Medical, Surgical, Social History, Allergies,  and Medications have been Reviewed.  Past Medical History:  Diagnosis Date  . Breast nodule 12/08/2014  . Breast pain, left 12/08/2014  . Elevated BP 12/08/2014  . Trichimoniasis 12/16/2014  . Trichimoniasis 12/16/2014   Past Surgical History:  Procedure Laterality Date  . TUBAL LIGATION       Current Meds  Medication Sig  . cyclobenzaprine (FLEXERIL) 5 MG tablet Take 1 tablet (5 mg total) by mouth 3 (three) times daily as needed for muscle spasms.  Marland Kitchen ibuprofen (ADVIL) 600 MG tablet Take 1 tablet (600 mg total) by mouth every 8 (eight) hours as needed for moderate pain.     Allergies:   Patient has no known allergies.   ROS:   Please see the history of present illness.     All other systems reviewed and are negative.   Labs/Other Tests and Data Reviewed:    Recent Labs: 07/16/2019: ALT 17; BUN 9; Creatinine, Ser 0.60; Hemoglobin 12.5; Platelets 316; Potassium 4.3; Sodium 139; TSH 1.300   Recent Lipid Panel Lab Results  Component Value Date/Time   CHOL 150 07/16/2019 09:29 AM   TRIG 85 07/16/2019 09:29 AM   HDL 49 07/16/2019 09:29 AM   CHOLHDL 3.1 07/16/2019 09:29 AM   LDLCALC 85 07/16/2019 09:29 AM    Wt Readings from Last 3 Encounters:  03/21/20 183 lb (83 kg)  03/14/20 182 lb 0.6 oz (82.6 kg)  12/07/19 181 lb 6.4 oz (82.3 kg)      ASSESSMENT & PLAN:    Hypertension BP Readings from Last 1 Encounters:  03/21/20 (!) 162/112  Could be elevated due to acute infection and/or cough/cold medications, but does not justify such elevated BP Not on any medications, discussed about starting Amlodipine,  Agrees to start Amlodipine Advised DASH diet and moderate exercise/walking, at least 150 mins/week  Suspected COVID-19 infection Cough and headache Unlikely from flu vaccine as it was in the last week. F/u COVID RTPCR Advised to self-quarantine even while waiting for test results Mucinex PRN Albuterol PRN  Time:   Today, I have spent 15 minutes reviewing the  chart, including problem list, medications, and with the patient with telehealth technology discussing the above problems.   Medication Adjustments/Labs and Tests Ordered: Current medicines are reviewed at length with the patient today.  Concerns regarding medicines are outlined above.   Tests Ordered: No orders of the defined types were placed in this encounter.   Medication Changes: No orders of the defined types were placed in this encounter.    Note: This dictation was prepared with Dragon dictation along with smaller phrase technology. Similar sounding words can be transcribed inadequately or may not be corrected upon review. Any transcriptional errors that result from this process are unintentional.      Disposition:  Follow up  Signed, Lindell Spar, MD  03/21/2020 3:00 PM     [facility]

## 2020-03-21 NOTE — Patient Instructions (Signed)
Please contact us with your COVID test result.  Please take Mucinex or Robitussin for cough.  Please stay hydrated by taking at least 2 liters of fluid in a day.  Please use Albuterol inhaler as prescribed for shortness of breath or wheezing.

## 2020-03-21 NOTE — Assessment & Plan Note (Signed)
BP Readings from Last 1 Encounters:  03/21/20 (!) 162/112   Could be elevated due to acute infection and/or cough/cold medications, but does not justify such elevated BP Not on any medications, discussed about starting Amlodipine,  Agrees to start Amlodipine Advised DASH diet and moderate exercise/walking, at least 150 mins/week

## 2020-03-23 LAB — NOVEL CORONAVIRUS, NAA: SARS-CoV-2, NAA: DETECTED — AB

## 2020-03-24 ENCOUNTER — Telehealth: Payer: Self-pay

## 2020-03-24 NOTE — Telephone Encounter (Signed)
Called to discuss with patient about COVID-19 symptoms and the use of one of the available treatments for those with mild to moderate Covid symptoms and at a high risk of hospitalization.  Pt appears to qualify for outpatient treatment due to co-morbid conditions and/or a member of an at-risk group in accordance with the FDA Emergency Use Authorization.    Symptom onset: 03/14/20 per chart Vaccinated: Yes Booster? Yes Immunocompromised? No Qualifiers: HTN  Unable to reach pt - Pt. Is out of 7 day window for treatment.   Julie Butler

## 2020-06-13 ENCOUNTER — Ambulatory Visit (INDEPENDENT_AMBULATORY_CARE_PROVIDER_SITE_OTHER): Payer: 59 | Admitting: Internal Medicine

## 2020-06-13 ENCOUNTER — Encounter: Payer: Self-pay | Admitting: Internal Medicine

## 2020-06-13 ENCOUNTER — Other Ambulatory Visit: Payer: Self-pay

## 2020-06-13 VITALS — BP 135/87 | HR 96 | Resp 18 | Ht 63.0 in | Wt 183.0 lb

## 2020-06-13 DIAGNOSIS — Z0000 Encounter for general adult medical examination without abnormal findings: Secondary | ICD-10-CM | POA: Diagnosis not present

## 2020-06-13 DIAGNOSIS — I10 Essential (primary) hypertension: Secondary | ICD-10-CM

## 2020-06-13 DIAGNOSIS — Z1211 Encounter for screening for malignant neoplasm of colon: Secondary | ICD-10-CM | POA: Diagnosis not present

## 2020-06-13 DIAGNOSIS — G43809 Other migraine, not intractable, without status migrainosus: Secondary | ICD-10-CM | POA: Diagnosis not present

## 2020-06-13 DIAGNOSIS — G43909 Migraine, unspecified, not intractable, without status migrainosus: Secondary | ICD-10-CM | POA: Insufficient documentation

## 2020-06-13 DIAGNOSIS — Z0100 Encounter for examination of eyes and vision without abnormal findings: Secondary | ICD-10-CM

## 2020-06-13 MED ORDER — SUMATRIPTAN SUCCINATE 50 MG PO TABS: 50.0000 mg | ORAL_TABLET | ORAL | 0 refills | Status: DC | PRN

## 2020-06-13 NOTE — Assessment & Plan Note (Signed)
BP Readings from Last 1 Encounters:  06/13/20 135/87   Well-controlled with Amlodipine Counseled for compliance with the medications Advised DASH diet and moderate exercise/walking, at least 150 mins/week

## 2020-06-13 NOTE — Assessment & Plan Note (Signed)
Annual exam as documented. Counseling done  re healthy lifestyle involving commitment to 150 minutes exercise per week, heart healthy diet, and attaining healthy weight.The importance of adequate sleep also discussed. Changes in health habits are decided on by the patient with goals and time frames  set for achieving them. Immunization and cancer screening needs are specifically addressed at this visit. 

## 2020-06-13 NOTE — Progress Notes (Signed)
Established Patient Office Visit  Subjective:  Patient ID: Julie Butler, female    DOB: Feb 02, 1976  Age: 45 y.o. MRN: [id number]  CC:  Chief Complaint  Patient presents with  . Annual Exam    Annual exam pt was having very bad headaches down her ear and neck and swelling on one side has been taking tylenol but this isnt working    HPI Julie Panozzo Navarrois a 45 year old female with PMH of HTN, prediabetes and obesity whopresents for  presents for annual physical.  HTN: BP is well-controlled. Takes medications regularly. Patient denies headache, dizziness, chest pain, dyspnea or palpitations.  She has been trying to follow low carb diet. Her weight has been overall stable.  She c/o dull headache over left temporal area for last 4 days, which has been varying in intensity. She has tried taking Tylenol with minimal relief. She also had nausea and photophobia. She also mentions remote concussion injury about 6 years ago. She denies any numbness, tingling or weakness of the UE or LE.  Past Medical History:  Diagnosis Date  . Breast nodule 12/08/2014  . Breast pain, left 12/08/2014  . Elevated BP 12/08/2014  . Trichimoniasis 12/16/2014  . Trichimoniasis 12/16/2014    Past Surgical History:  Procedure Laterality Date  . TUBAL LIGATION      Family History  Problem Relation Age of Onset  . Hyperlipidemia Mother   . Hypertension Mother   . Diabetes Mother   . Hypertension Father   . Hyperlipidemia Father   . Heart attack Father   . Diabetes Sister   . Hypertension Sister   . Hyperlipidemia Sister     Social History   Socioeconomic History  . Marital status: Married    Spouse name: Not on file  . Number of children: 3  . Years of education: Not on file  . Highest education level: Not on file  Occupational History  . Not on file  Tobacco Use  . Smoking status: Never Smoker  . Smokeless tobacco: Never Used  Vaping Use  . Vaping Use: Never used   Substance and Sexual Activity  . Alcohol use: No  . Drug use: No  . Sexual activity: Yes    Birth control/protection: Surgical    Comment: tubal  Other Topics Concern  . Not on file  Social History Narrative  . Not on file   Social Determinants of Health   Financial Resource Strain: Not on file  Food Insecurity: Not on file  Transportation Needs: Not on file  Physical Activity: Not on file  Stress: Not on file  Social Connections: Not on file  Intimate Partner Violence: Not on file    Outpatient Medications Prior to Visit  Medication Sig Dispense Refill  . albuterol (VENTOLIN HFA) 108 (90 Base) MCG/ACT inhaler Inhale 2 puffs into the lungs every 6 (six) hours as needed for wheezing or shortness of breath. 8 g 0  . amLODipine (NORVASC) 5 MG tablet Take 1 tablet (5 mg total) by mouth daily. 90 tablet 1  . cyclobenzaprine (FLEXERIL) 5 MG tablet Take 1 tablet (5 mg total) by mouth 3 (three) times daily as needed for muscle spasms. 30 tablet 1  . ibuprofen (ADVIL) 600 MG tablet Take 1 tablet (600 mg total) by mouth every 8 (eight) hours as needed for moderate pain. 30 tablet 0   No facility-administered medications prior to visit.    No Known Allergies  ROS Review of Systems  Constitutional: Negative for chills  and fever.  HENT: Negative for congestion, sinus pressure, sinus pain and sore throat.   Eyes: Negative for pain and discharge.  Respiratory: Negative for cough and shortness of breath.   Cardiovascular: Negative for chest pain and palpitations.  Gastrointestinal: Negative for abdominal pain, constipation, diarrhea, nausea and vomiting.  Endocrine: Negative for polydipsia and polyuria.  Genitourinary: Negative for dysuria and hematuria.  Musculoskeletal: Positive for back pain and neck pain. Negative for neck stiffness.  Skin: Negative for rash.  Neurological: Negative for dizziness and weakness.  Psychiatric/Behavioral: Negative for agitation and behavioral  problems.      Objective:    Physical Exam Vitals reviewed.  Constitutional:      General: She is not in acute distress.    Appearance: She is not diaphoretic.  HENT:     Head: Normocephalic and atraumatic.     Nose: Nose normal. No congestion.     Mouth/Throat:     Mouth: Mucous membranes are moist.     Pharynx: No posterior oropharyngeal erythema.  Eyes:     General: No scleral icterus.    Extraocular Movements: Extraocular movements intact.     Pupils: Pupils are equal, round, and reactive to light.  Cardiovascular:     Rate and Rhythm: Normal rate and regular rhythm.     Pulses: Normal pulses.     Heart sounds: Normal heart sounds. No murmur heard.   Pulmonary:     Breath sounds: Normal breath sounds. No wheezing or rales.  Musculoskeletal:        General: No swelling.     Cervical back: Neck supple. No tenderness.     Right lower leg: No edema.     Left lower leg: No edema.  Skin:    General: Skin is warm.     Findings: No rash.  Neurological:     General: No focal deficit present.     Mental Status: She is alert and oriented to person, place, and time.     Cranial Nerves: No cranial nerve deficit.     Sensory: No sensory deficit.     Motor: No weakness.  Psychiatric:        Mood and Affect: Mood normal.        Behavior: Behavior normal.     BP 135/87 (BP Location: Right Arm, Patient Position: Sitting, Cuff Size: Normal)   Pulse 96   Resp 18   Ht 5\' 3"  (1.6 m)   Wt 183 lb (83 kg)   SpO2 98%   BMI 32.42 kg/m  Wt Readings from Last 3 Encounters:  06/13/20 183 lb (83 kg)  03/21/20 183 lb (83 kg)  03/14/20 182 lb 0.6 oz (82.6 kg)     Health Maintenance Due  Topic Date Due  . Hepatitis C Screening  Never done  . COVID-19 Vaccine (3 - Inadvertent risk 4-dose series) 07/06/2019  . COLONOSCOPY (Pts 45-35yrs Insurance coverage will need to be confirmed)  Never done    There are no preventive care reminders to display for this patient.  Lab Results   Component Value Date   TSH 1.300 07/16/2019   Lab Results  Component Value Date   WBC 6.5 07/16/2019   HGB 12.5 07/16/2019   HCT 38.0 07/16/2019   MCV 80 07/16/2019   PLT 316 07/16/2019   Lab Results  Component Value Date   NA 139 07/16/2019   K 4.3 07/16/2019   CO2 23 07/16/2019   GLUCOSE 100 (H) 07/16/2019   BUN 9  07/16/2019   CREATININE 0.60 07/16/2019   BILITOT 0.4 07/16/2019   ALKPHOS 88 07/16/2019   AST 20 07/16/2019   ALT 17 07/16/2019   PROT 6.9 07/16/2019   ALBUMIN 4.2 07/16/2019   CALCIUM 9.0 07/16/2019   Lab Results  Component Value Date   CHOL 150 07/16/2019   Lab Results  Component Value Date   HDL 49 07/16/2019   Lab Results  Component Value Date   LDLCALC 85 07/16/2019   Lab Results  Component Value Date   TRIG 85 07/16/2019   Lab Results  Component Value Date   CHOLHDL 3.1 07/16/2019   Lab Results  Component Value Date   HGBA1C 5.8 (H) 07/16/2019      Assessment & Plan:   Problem List Items Addressed This Visit      Annual physical exam - Primary   Annual exam as documented. Counseling done  re healthy lifestyle involving commitment to 150 minutes exercise per week, heart healthy diet, and attaining healthy weight.The importance of adequate sleep also discussed. Changes in health habits are decided on by the patient with goals and time frames  set for achieving them. Immunization and cancer screening needs are specifically addressed at this visit.        Cardiovascular and Mediastinum   Hypertension    BP Readings from Last 1 Encounters:  06/13/20 135/87   Well-controlled with Amlodipine Counseled for compliance with the medications Advised DASH diet and moderate exercise/walking, at least 150 mins/week       Migraine    Headache appears likely related to migraine Trial of Sumatriptan If persistent headache, will perform imaging considering h/o concussion injury.      Relevant Medications   SUMAtriptan (IMITREX) 50  MG tablet     Other         Other Visit Diagnoses    Special screening for malignant neoplasms, colon       Relevant Orders   Cologuard   Routine eye exam       Relevant Orders   Ambulatory referral to Ophthalmology      Meds ordered this encounter  Medications  . SUMAtriptan (IMITREX) 50 MG tablet    Sig: Take 1 tablet (50 mg total) by mouth every 2 (two) hours as needed for migraine. May repeat in 2 hours if headache persists or recurs.Maximum 2 tablets/day.    Dispense:  10 tablet    Refill:  0    Follow-up: Return in about 4 months (around 10/13/2020) for HTN and migraine.    Lindell Spar, MD

## 2020-06-13 NOTE — Assessment & Plan Note (Signed)
Headache appears likely related to migraine Trial of Sumatriptan If persistent headache, will perform imaging considering h/o concussion injury.

## 2020-06-13 NOTE — Patient Instructions (Addendum)
Please take Sumatriptan as prescribed for migraine.  You are being referred to Ophthalmology for routine ye exam.  You will receive Cologuard for colon cancer screening. Please follow instructions for stool collection and send it back.  Please follow DASH diet and perform moderate exercise/walking at least 150 mins/week.   [email or web address]">  Plan de alimentacin DASH DASH Eating Plan DASH es la sigla en ingls de "Enfoques Alimentarios para Detener la Hipertensin". El plan de alimentacin DASH ha demostrado:  Bajar la presin arterial elevada (hipertensin).  Reducir el riesgo de diabetes tipo 2, enfermedad cardaca y accidente cerebrovascular.  Ayudar a perder peso. Consejos para seguir Photographer las etiquetas de los alimentos  Verifique la cantidad de sal (sodio) por porcin en las etiquetas de los alimentos. Elija alimentos con menos del 5 por ciento del valor diario de sodio. Generalmente, los alimentos con menos de 300 miligramos (mg) de sodio por porcin se encuadran dentro de este plan alimentario.  Para encontrar cereales integrales, busque la palabra "integral" como primera palabra en la lista de ingredientes. Al ir de compras  Compre productos en los que en su etiqueta diga: "bajo contenido de sodio" o "sin agregado de sal".  Compre alimentos frescos. Evite los alimentos enlatados y comidas precocidas o congeladas. Al cocinar  Evite agregar sal cuando cocine. Use hierbas o aderezos sin sal, en lugar de sal de mesa o sal marina. Consulte al mdico o farmacutico antes de usar sustitutos de la sal.  No fra los alimentos. A la hora de cocinar los alimentos opte por hornearlos, hervirlos, grillarlos, asarlos al horno y asarlos a Administrator, arts.  Cocine con aceites cardiosaludables, como oliva, canola, aguacate, soja o girasol. Planificacin de las comidas  Consuma una dieta equilibrada, que incluya lo siguiente: ? 4o  ms porciones de frutas y 4 o ms porciones de Set designer. Trate de que medio plato de cada comida sea de frutas y verduras. ? De 6 a 8porciones de cereales [employer]. ? Menos de 6 onzas (170g) de carne, aves o pescado Games developer. Una porcin de 3 onzas (85g) de carne tiene casi el mismo tamao que un mazo de cartas. Un huevo equivale a 1 onza (28g). ? De 2 a 3 porciones de productos lcteos descremados por da. Una porcin es 1taza (264ml). ? 1 porcin de frutos secos, semillas o frijoles 5 veces por semana. ? De 2 a 3 porciones de grasas cardiosaludables. Las grasas saludables llamadas cidos grasos omega-3 se encuentran en alimentos como las nueces, las semillas de Admire, las leches fortificadas y Cutter. Estas grasas tambin se encuentran en los pescados de agua fra, como la sardina, el salmn y la caballa.  Limite la cantidad que consume de: ? Alimentos enlatados o envasados. ? Alimentos con alto contenido de grasa trans, como algunos alimentos fritos. ? Alimentos con alto contenido de grasa saturada, como carne con grasa. ? Postres y otros dulces, bebidas azucaradas y otros alimentos con azcar agregada. ? Productos lcteos enteros.  No le agregue sal a los alimentos antes de probarlos.  No coma ms de 4 yemas de huevo por semana.  Trate de comer al menos 2 comidas vegetarianas por semana.  Consuma ms comida casera y menos de restaurante, de bares y comida rpida.   Estilo de vida  Cuando coma en un restaurante, pida que preparen su comida con menos sal o, en lo posible, sin nada de sal.  Si bebe alcohol: ? Limite la cantidad que  bebe:  De 0 a 1 medida por da para las mujeres que no estn embarazadas.  De 0 a 2 medidas por da para los hombres. ? Est atento a la cantidad de alcohol que hay en las bebidas que toma. En los [place], una medida equivale a una botella de cerveza de 12oz (342ml), un vaso de vino de 5oz (129ml) o un vaso  de una bebida alcohlica de alta graduacin de 1oz (60ml). Informacin general  Evite ingerir ms de 2300 mg de sal por da. Si tiene hipertensin, es posible que necesite reducir la ingesta de sodio a 1,500 mg por da.  Trabaje con su mdico para mantener un peso saludable o perder [employer]. Pregntele cul es el peso recomendado para usted.  Realice al menos 30 minutos de ejercicio que haga que se acelere su corazn (ejercicio Arboriculturist) la [employer] de la [place]. Estas actividades pueden incluir caminar, nadar o andar en bicicleta.  Trabaje con su mdico o nutricionista para ajustar su plan alimentario a sus necesidades calricas personales. Qu alimentos debo comer? Frutas Todas las frutas frescas, congeladas o disecadas. Frutas enlatadas en jugo natural (sin agregado de azcar). Verduras Verduras frescas o congeladas (crudas, al vapor, asadas o grilladas). Jugos de tomate y verduras con bajo contenido de sodio o reducidos en sodio. Salsa y pasta de tomate con bajo contenido de sodio o reducidas en sodio. Verduras enlatadas con bajo contenido de sodio o reducidas en sodio. Granos Pan de salvado o integral. Pasta de salvado o integral. Arroz integral. Avena. Quinua. Trigo burgol. Cereales integrales y con bajo contenido de sodio. Pan pita. Galletitas de [place] con bajo contenido de [place] y [place]. Tortillas de [place] integral. Carnes y otras protenas Pollo o pavo sin piel. Carne de pollo o de [place]. Cerdo desgrasado. Pescado y [employer]. Claras de huevo. Porotos, guisantes o lentejas secos. Frutos secos, mantequilla de frutos secos y semillas sin sal. Frijoles enlatados sin sal. Cortes de carne vacuna magra, desgrasada. Carne precocida o curada magra y baja en sodio, como embutidos o panes de carne. Lcteos Leche descremada (1%) o descremada. Quesos reducidos en grasa, con bajo contenido de grasa o descremados. Queso blanco o ricota sin grasa, con bajo contenido de [place]. Yogur  semidescremado o descremado. Queso con bajo contenido de [place] y [place]. Grasas y [employer] untables que no contengan grasas trans. Aceite vegetal. Lubertha Basque y aderezos para ensaladas livianos, reducidos en grasa o con bajo contenido de grasas (reducidos en sodio). Aceite de canola, crtamo, oliva, aguacate, soja y [place]. Agu[place]. Alios y condimentos Hierbas. Especias. Mezclas de condimentos sin sal. Otros alimentos Palomitas de maz y pretzels sin sal. Dulces con bajo contenido de grasas. Es posible que los productos que se enumeran ms [place] no constituyan una lista completa de los alimentos y las bebidas que puede tomar. Consulte a un nutricionista para obtener ms informacin. Qu alimentos debo evitar? Lambert Mody Fruta enlatada en almbar liviano o espeso. Frutas cocidas en aceite. Frutas con salsa de crema o mantequilla. Verduras Verduras con crema o fritas. Verduras en [place]. Verduras enlatadas regulares (que no sean con bajo contenido de sodio o reducidas en sodio). Pasta y salsa de tomates enlatadas regulares (que no sean con bajo contenido de sodio o reducidas en sodio). Jugos de tomate y verduras regulares (que no sean con bajo contenido de sodio o reducidos en sodio). Pepinillos. Aceitunas. Granos Productos de panificacin hechos con grasa, como medialunas, magdalenas y algunos panes. Comidas con Lorre Nick o  pasta seca listas para usar. Carnes y otras protenas Cortes de carne con alto contenido de Lobbyist. Costillas. Carne frita. [place]. [place], salame y otras carnes precocidas o curadas, como embutidos o panes de carne. Grasa de la espalda del cerdo (panceta). [place]. Frutos secos y semillas con sal. Frijoles enlatados con agregado de sal. Pescado enlatado o ahumado. Huevos enteros o yemas. Pollo o pavo con piel. Lcteos Leche entera o al 2%, crema y mitad leche y mitad crema. Queso crema entero o con toda su grasa. Yogur entero o endulzado. Quesos con toda  su grasa. Sustitutos de cremas no lcteas. Coberturas batidas. Quesos para untar y quesos procesados. Grasas y [employer]. Margarina en barra. [place]. Lardo. Mantequilla clarificada. Grasa de panceta. Aceites tropicales como aceite de coco, palmiste o palma. Alios y condimentos Sal de cebolla, sal de ajo, sal condimentada, sal de mesa y sal marina. Salsa [place]. Salsa trtara. Salsa barbacoa. Salsa teriyaki. Salsa de soja, incluso la que tiene contenido reducido de [place]. Salsa de carne. Salsas en lata y envasadas. Salsa de pescado. Salsa de [place]. Salsa rosada. Rbanos picantes comprados en tiendas. [place]. [place]aza. Saborizantes y tiernizantes para carne. Caldo en cubitos. Salsas picantes. Adobos preelaborados o envasados. Aderezos para tacos preelaborados o envasados. Salsas de pepinillos. Aderezos comunes para ensalada. Otros alimentos Palomitas de maz y pretzels con sal. Es posible que los productos que se enumeran ms arriba no constituyan una lista completa de los alimentos y las bebidas que Nurse, adult. Consulte a un nutricionista para obtener ms informacin. Dnde buscar ms informacin  National Heart, Lung, and Blood Institute ([place], los Pulmones y Herbalist): https://wilson-eaton.com/  American Heart Association (Asociacin Estadounidense del Corazn): www.heart.org  Academy of Nutrition and Dietetics (Academia de Nutricin y Information systems manager): www.eatright.[place] ([place]): www.kidney.org Resumen  El plan de alimentacin DASH ha demostrado bajar la presin arterial elevada (hipertensin). Tambin puede reducir [employer] de diabetes tipo 2, enfermedad cardaca y accidente cerebrovascular.  Cuando siga el plan de alimentacin DASH, trate de comer ms frutas frescas y verduras, cereales integrales, carnes magras, lcteos descremados y grasas cardiosaludables.  Con el plan de alimentacin DASH,  deber limitar el consumo de sal (sodio) a 2,300 mg por da. Si tiene hipertensin, es posible que necesite reducir la ingesta de sodio a 1,500 mg por da.  Trabaje con su mdico o nutricionista para ajustar su plan alimentario a sus necesidades calricas personales. Esta informacin no tiene Marine scientist el consejo del mdico. Asegrese de hacerle al mdico cualquier pregunta que tenga. Document Revised: 03/26/2019 Document Reviewed: 03/26/2019 Elsevier Patient Education  2021 [employer].

## 2020-06-14 LAB — HEMOGLOBIN A1C
Est. average glucose Bld gHb Est-mCnc: 114 mg/dL
Hgb A1c MFr Bld: 5.6 % (ref 4.8–5.6)

## 2020-06-14 LAB — CBC WITH DIFFERENTIAL/PLATELET
Basophils Absolute: 0.1 10*3/uL (ref 0.0–0.2)
Basos: 1 %
EOS (ABSOLUTE): 0.2 10*3/uL (ref 0.0–0.4)
Eos: 2 %
Hematocrit: 38.8 % (ref 34.0–46.6)
Hemoglobin: 12.2 g/dL (ref 11.1–15.9)
Immature Grans (Abs): 0 10*3/uL (ref 0.0–0.1)
Immature Granulocytes: 0 %
Lymphocytes Absolute: 2.3 10*3/uL (ref 0.7–3.1)
Lymphs: 31 %
MCH: 23.6 pg — ABNORMAL LOW (ref 26.6–33.0)
MCHC: 31.4 g/dL — ABNORMAL LOW (ref 31.5–35.7)
MCV: 75 fL — ABNORMAL LOW (ref 79–97)
Monocytes Absolute: 0.4 10*3/uL (ref 0.1–0.9)
Monocytes: 6 %
Neutrophils Absolute: 4.6 10*3/uL (ref 1.4–7.0)
Neutrophils: 60 %
Platelets: 315 10*3/uL (ref 150–450)
RBC: 5.17 x10E6/uL (ref 3.77–5.28)
RDW: 15.3 % (ref 11.7–15.4)
WBC: 7.5 10*3/uL (ref 3.4–10.8)

## 2020-06-14 LAB — CMP14+EGFR
ALT: 14 IU/L (ref 0–32)
AST: 18 IU/L (ref 0–40)
Albumin/Globulin Ratio: 1.8 (ref 1.2–2.2)
Albumin: 4.5 g/dL (ref 3.8–4.8)
Alkaline Phosphatase: 94 IU/L (ref 44–121)
BUN/Creatinine Ratio: 17 (ref 9–23)
BUN: 11 mg/dL (ref 6–24)
Bilirubin Total: 0.4 mg/dL (ref 0.0–1.2)
CO2: 19 mmol/L — ABNORMAL LOW (ref 20–29)
Calcium: 9.3 mg/dL (ref 8.7–10.2)
Chloride: 105 mmol/L (ref 96–106)
Creatinine, Ser: 0.65 mg/dL (ref 0.57–1.00)
Globulin, Total: 2.5 g/dL (ref 1.5–4.5)
Glucose: 103 mg/dL — ABNORMAL HIGH (ref 65–99)
Potassium: 3.8 mmol/L (ref 3.5–5.2)
Sodium: 142 mmol/L (ref 134–144)
Total Protein: 7 g/dL (ref 6.0–8.5)
eGFR: 111 mL/min/{1.73_m2} (ref >59)

## 2020-06-14 LAB — LIPID PANEL
Chol/HDL Ratio: 3.7 ratio (ref 0.0–4.4)
Cholesterol, Total: 170 mg/dL (ref 100–199)
HDL: 46 mg/dL (ref >39)
LDL Chol Calc (NIH): 90 mg/dL (ref 0–99)
Triglycerides: 200 mg/dL — ABNORMAL HIGH (ref 0–149)
VLDL Cholesterol Cal: 34 mg/dL (ref 5–40)

## 2020-06-14 LAB — TSH: TSH: 1.74 u[IU]/mL (ref 0.450–4.500)

## 2020-06-14 LAB — VITAMIN D 25 HYDROXY (VIT D DEFICIENCY, FRACTURES): Vit D, 25-Hydroxy: 19.9 ng/mL — ABNORMAL LOW (ref 30.0–100.0)

## 2020-06-25 LAB — COLOGUARD: Cologuard: NEGATIVE

## 2020-06-27 LAB — COLOGUARD: Cologuard: NEGATIVE

## 2020-06-28 ENCOUNTER — Encounter: Payer: Self-pay | Admitting: *Deleted

## 2020-10-12 ENCOUNTER — Ambulatory Visit (INDEPENDENT_AMBULATORY_CARE_PROVIDER_SITE_OTHER): Payer: 59 | Admitting: Internal Medicine

## 2020-10-12 ENCOUNTER — Other Ambulatory Visit: Payer: Self-pay

## 2020-10-12 ENCOUNTER — Encounter: Payer: Self-pay | Admitting: Internal Medicine

## 2020-10-12 VITALS — BP 122/86 | HR 94 | Temp 98.6°F | Resp 18 | Ht 62.5 in | Wt 185.1 lb

## 2020-10-12 DIAGNOSIS — Z124 Encounter for screening for malignant neoplasm of cervix: Secondary | ICD-10-CM

## 2020-10-12 DIAGNOSIS — I10 Essential (primary) hypertension: Secondary | ICD-10-CM | POA: Diagnosis not present

## 2020-10-12 DIAGNOSIS — R079 Chest pain, unspecified: Secondary | ICD-10-CM | POA: Diagnosis not present

## 2020-10-12 NOTE — Patient Instructions (Addendum)
Please continue to follow low salt diet and perform moderate exercise/walking at least 150 mins/week.  You are being referred to Ob/Gyn for PAP smear.

## 2020-10-12 NOTE — Assessment & Plan Note (Signed)
Well-controlled with diet modification and exercise She had stopped taking Amlodipine, discontinued now

## 2020-10-12 NOTE — Assessment & Plan Note (Signed)
C/o left sided upper chest pain Reports left arm heaviness few days ago EKG: Sinus rhythm. HR 76. No signs of active ischemia.  Has mild chest wall tenderness, could be related to MSK pain. Needs Mammography for evaluation of breast pain/fullness as well

## 2020-10-12 NOTE — Progress Notes (Signed)
Established Patient Office Visit  Subjective:  Patient ID: Julie Butler, female    DOB: [birth year]  Age: 45 y.o. MRN: [id number]  CC:  Chief Complaint  Patient presents with   Follow-up    4 month follow up pt has been having left breast pain comes and goes feels like a wound pain she has been feeling very fatigued sometimes left arm feels tired     HPI Julie Butler is a 45 year old female with PMH of HTN, prediabetes and obesity who presents for follow up of her chronic medical conditions.  HTN: She has stopped taking Amlodipine and has been following low salt diet and has been exercising regularly. Denies any headache, dizziness, chest pain, dyspnea or palpitations.  She reports episodes of left sided upper chest pain, episodic, lasts about an hour and is unrelated to activity or rest. She also reports left breast pain and fullness. She had an episode of left arm fatigue/heaviness on one occasion, which resolved spontaneously. Denies any dyspnea or palpitations.  Past Medical History:  Diagnosis Date   Breast nodule 12/08/2014   Breast pain, left 12/08/2014   Elevated BP 12/08/2014   Trichimoniasis 12/16/2014   Trichimoniasis 12/16/2014    Past Surgical History:  Procedure Laterality Date   TUBAL LIGATION      Family History  Problem Relation Age of Onset   Hyperlipidemia Mother    Hypertension Mother    Diabetes Mother    Hypertension Father    Hyperlipidemia Father    Heart attack Father    Diabetes Sister    Hypertension Sister    Hyperlipidemia Sister     Social History   Socioeconomic History   Marital status: Married    Spouse name: Not on file   Number of children: 3   Years of education: Not on file   Highest education level: Not on file  Occupational History   Not on file  Tobacco Use   Smoking status: Never   Smokeless tobacco: Never  Vaping Use   Vaping Use: Never used  Substance and Sexual Activity   Alcohol use: No    Drug use: No   Sexual activity: Yes    Birth control/protection: Surgical    Comment: tubal  Other Topics Concern   Not on file  Social History Narrative   Not on file   Social Determinants of Health   Financial Resource Strain: Not on file  Food Insecurity: Not on file  Transportation Needs: Not on file  Physical Activity: Not on file  Stress: Not on file  Social Connections: Not on file  Intimate Partner Violence: Not on file    Outpatient Medications Prior to Visit  Medication Sig Dispense Refill   albuterol (VENTOLIN HFA) 108 (90 Base) MCG/ACT inhaler Inhale 2 puffs into the lungs every 6 (six) hours as needed for wheezing or shortness of breath. (Patient not taking: Reported on 10/12/2020) 8 g 0   amLODipine (NORVASC) 5 MG tablet Take 1 tablet (5 mg total) by mouth daily. (Patient not taking: Reported on 10/12/2020) 90 tablet 1   cyclobenzaprine (FLEXERIL) 5 MG tablet Take 1 tablet (5 mg total) by mouth 3 (three) times daily as needed for muscle spasms. (Patient not taking: Reported on 10/12/2020) 30 tablet 1   ibuprofen (ADVIL) 600 MG tablet Take 1 tablet (600 mg total) by mouth every 8 (eight) hours as needed for moderate pain. (Patient not taking: Reported on 10/12/2020) 30 tablet 0   SUMAtriptan (IMITREX) 50  MG tablet Take 1 tablet (50 mg total) by mouth every 2 (two) hours as needed for migraine. May repeat in 2 hours if headache persists or recurs.Maximum 2 tablets/day. (Patient not taking: Reported on 10/12/2020) 10 tablet 0   No facility-administered medications prior to visit.    No Known Allergies  ROS Review of Systems  Constitutional:  Negative for chills and fever.  HENT:  Negative for congestion, sinus pressure, sinus pain and sore throat.   Eyes:  Negative for pain and discharge.  Respiratory:  Negative for cough and shortness of breath.   Cardiovascular:  Negative for chest pain and palpitations.  Gastrointestinal:  Negative for abdominal pain, constipation,  diarrhea, nausea and vomiting.  Endocrine: Negative for polydipsia and polyuria.  Genitourinary:  Negative for dysuria and hematuria.  Musculoskeletal:  Positive for back pain. Negative for neck pain and neck stiffness.  Skin:  Negative for rash.  Neurological:  Negative for dizziness and weakness.  Psychiatric/Behavioral:  Negative for agitation and behavioral problems.      Objective:    Physical Exam Vitals reviewed.  Constitutional:      General: She is not in acute distress.    Appearance: She is not diaphoretic.  HENT:     Head: Normocephalic and atraumatic.     Nose: Nose normal. No congestion.     Mouth/Throat:     Mouth: Mucous membranes are moist.     Pharynx: No posterior oropharyngeal erythema.  Eyes:     General: No scleral icterus.    Extraocular Movements: Extraocular movements intact.     Pupils: Pupils are equal, round, and reactive to light.  Cardiovascular:     Rate and Rhythm: Normal rate and regular rhythm.     Pulses: Normal pulses.     Heart sounds: Normal heart sounds. No murmur heard. Pulmonary:     Breath sounds: Normal breath sounds. No wheezing or rales.  Chest:     Chest wall: Tenderness present.  Musculoskeletal:        General: No swelling.     Cervical back: Neck supple. No tenderness.     Right lower leg: No edema.     Left lower leg: No edema.  Skin:    General: Skin is warm.     Findings: No rash.  Neurological:     General: No focal deficit present.     Mental Status: She is alert and oriented to person, place, and time.     Cranial Nerves: No cranial nerve deficit.     Sensory: No sensory deficit.     Motor: No weakness.  Psychiatric:        Mood and Affect: Mood normal.        Behavior: Behavior normal.    BP 122/86 (BP Location: Left Arm, Patient Position: Sitting, Cuff Size: Normal)   Pulse 94   Temp 98.6 F (37 C) (Oral)   Resp 18   Ht 5' 2.5" (1.588 m)   Wt 185 lb 1.9 oz (84 kg)   SpO2 98%   BMI 33.32 kg/m  Wt  Readings from Last 3 Encounters:  10/12/20 185 lb 1.9 oz (84 kg)  06/13/20 183 lb (83 kg)  03/21/20 183 lb (83 kg)     Health Maintenance Due  Topic Date Due   Pneumococcal Vaccine 3-9 Years old (1 - PCV) Never done   Hepatitis C Screening  Never done   COVID-19 Vaccine (3 - Mixed Product risk series) 07/06/2019   INFLUENZA VACCINE  10/03/2020   PAP SMEAR-Modifier  11/11/2020    There are no preventive care reminders to display for this patient.  Lab Results  Component Value Date   TSH 1.740 06/13/2020   Lab Results  Component Value Date   WBC 7.5 06/13/2020   HGB 12.2 06/13/2020   HCT 38.8 06/13/2020   MCV 75 (L) 06/13/2020   PLT 315 06/13/2020   Lab Results  Component Value Date   NA 142 06/13/2020   K 3.8 06/13/2020   CO2 19 (L) 06/13/2020   GLUCOSE 103 (H) 06/13/2020   BUN 11 06/13/2020   CREATININE 0.65 06/13/2020   BILITOT 0.4 06/13/2020   ALKPHOS 94 06/13/2020   AST 18 06/13/2020   ALT 14 06/13/2020   PROT 7.0 06/13/2020   ALBUMIN 4.5 06/13/2020   CALCIUM 9.3 06/13/2020   EGFR 111 06/13/2020   Lab Results  Component Value Date   CHOL 170 06/13/2020   Lab Results  Component Value Date   HDL 46 06/13/2020   Lab Results  Component Value Date   LDLCALC 90 06/13/2020   Lab Results  Component Value Date   TRIG 200 (H) 06/13/2020   Lab Results  Component Value Date   CHOLHDL 3.7 06/13/2020   Lab Results  Component Value Date   HGBA1C 5.6 06/13/2020      Assessment & Plan:   Problem List Items Addressed This Visit       Cardiovascular and Mediastinum   Hypertension - Primary    Well-controlled with diet modification and exercise She had stopped taking Amlodipine, discontinued now         Other   Chest pain    C/o left sided upper chest pain Reports left arm heaviness few days ago EKG: Sinus rhythm. HR 76. No signs of active ischemia.  Has mild chest wall tenderness, could be related to MSK pain. Needs Mammography for  evaluation of breast pain/fullness as well       Relevant Orders   EKG 12-Lead (Completed)   Other Visit Diagnoses     Screening for cervical cancer       Relevant Orders   Ambulatory referral to Obstetrics / Gynecology       No orders of the defined types were placed in this encounter.   Follow-up: Return in about 6 months (around 04/14/2021) for Annual physical.    Lindell Spar, MD

## 2020-10-13 ENCOUNTER — Ambulatory Visit: Payer: 59 | Admitting: Internal Medicine

## 2020-12-01 ENCOUNTER — Encounter: Payer: Self-pay | Admitting: Adult Health

## 2020-12-01 ENCOUNTER — Ambulatory Visit (INDEPENDENT_AMBULATORY_CARE_PROVIDER_SITE_OTHER): Payer: 59 | Admitting: Adult Health

## 2020-12-01 ENCOUNTER — Other Ambulatory Visit: Payer: Self-pay

## 2020-12-01 ENCOUNTER — Other Ambulatory Visit (HOSPITAL_COMMUNITY)
Admission: RE | Admit: 2020-12-01 | Discharge: 2020-12-01 | Disposition: A | Discharge status: Home / Self Care | Payer: 59 | Source: Ambulatory Visit | Attending: Adult Health | Admitting: Adult Health

## 2020-12-01 VITALS — BP 133/88 | HR 82 | Ht 62.5 in | Wt 188.0 lb

## 2020-12-01 DIAGNOSIS — Z01419 Encounter for gynecological examination (general) (routine) without abnormal findings: Secondary | ICD-10-CM | POA: Insufficient documentation

## 2020-12-01 DIAGNOSIS — N3946 Mixed incontinence: Secondary | ICD-10-CM | POA: Diagnosis not present

## 2020-12-01 DIAGNOSIS — Z1231 Encounter for screening mammogram for malignant neoplasm of breast: Secondary | ICD-10-CM

## 2020-12-01 DIAGNOSIS — N951 Menopausal and female climacteric states: Secondary | ICD-10-CM

## 2020-12-01 DIAGNOSIS — Z1211 Encounter for screening for malignant neoplasm of colon: Secondary | ICD-10-CM | POA: Diagnosis not present

## 2020-12-01 LAB — HEMOCCULT GUIAC POC 1CARD (OFFICE): Fecal Occult Blood, POC: NEGATIVE

## 2020-12-01 MED ORDER — MIRABEGRON ER 25 MG PO TB24: 25.0000 mg | ORAL_TABLET | Freq: Every day | ORAL | 0 refills | Status: DC

## 2020-12-01 NOTE — Addendum Note (Signed)
Addended by: Octaviano Glow on: 12/01/2020 02:13 PM   Modules accepted: Orders

## 2020-12-01 NOTE — Progress Notes (Signed)
Patient ID: Julie Butler, female   DOB: 1975/03/23, 45 y.o.   MRN: [id number] History of Present Illness: Julie Butler is a 45 year old Hispanic female,married, G4P4, in for pelvic and pap, and breat exam. She had physical and labs with PCP. Julie Butler is her interpreter. PCP is Dr Posey Pronto.  Current Medications, Allergies, Past Medical History, Past Surgical History, Family History and Social History were reviewed in [employer] record.     Review of Systems: Periods lighter +tired Has occasional headache +urinary incontinence,if coughs or has to go    Physical Exam:BP 133/88 (BP Location: Left Arm, Patient Position: Sitting, Cuff Size: Normal)   Pulse 82   Ht 5' 2.5" (1.588 m)   Wt 188 lb (85.3 kg)   LMP 11/17/2020   BMI 33.84 kg/m   General:  Well developed, well nourished, no acute distress Skin:  Warm and dry Breast:  No dominant palpable mass, retraction, or nipple discharge Pelvic:  External genitalia is normal in appearance, no lesions.  The vagina is normal in appearance. Urethra has no lesions or masses. The cervix is bulbous. Pap with HR HPV genotyping performed. Uterus is felt to be normal size, shape, and contour.  No adnexal masses or tenderness noted.Bladder is non tender, no masses felt. Rectal: Good sphincter tone, no polyps, or hemorrhoids felt.  Hemoccult negative. Extremities/musculoskeletal:  No swelling or varicosities noted, no clubbing or cyanosis,has toe nail fungus big toe on right foot(see dermatologist) Psych:  No mood changes, alert and cooperative,seems happy AA is 0  Fall risk is low Depression screen [facility] 2/9 12/01/2020 10/12/2020 06/13/2020  Decreased Interest 3 0 0  Down, Depressed, Hopeless 2 0 0  PHQ - 2 Score 5 0 0  Altered sleeping 0 - -  Tired, decreased energy 3 - -  Change in appetite 0 - -  Feeling bad or failure about yourself  0 - -  Trouble concentrating 2 - -  Moving slowly or fidgety/restless 0 - -  Suicidal  thoughts 0 - -  PHQ-9 Score 10 - -  Difficult doing work/chores - - -   She declines meds, mom has been sick   GAD 7 : Generalized Anxiety Score 12/01/2020  Nervous, Anxious, on Edge 1  Control/stop worrying 0  Worry too much - different things 0  Trouble relaxing 2  Restless 0  Easily annoyed or irritable 1  Afraid - awful might happen 1  Total GAD 7 Score 5    Upstream - 12/01/20 1143       Pregnancy Intention Screening   Does the patient want to become pregnant in the next year? N/A    Does the patient's partner want to become pregnant in the next year? N/A    Would the patient like to discuss contraceptive options today? No      Contraception Wrap Up   Current Method Female Sterilization    End Method Female Sterilization    Contraception Counseling Provided No            Examination chaperoned by Celene Squibb LPN    Impression and Plan: 1. Encounter for gynecological examination with Papanicolaou smear of cervix Pap sent Physical with PCP Pap in 3 years of normal She had negative cologuard Labs with PCP  2. Encounter for screening fecal occult blood testing  - POCT occult blood stool  3. Mixed stress and urge urinary incontinence Will try myrbetriq, I gave her samples and let me know if helps will send  in rx Meds ordered this encounter  Medications   mirabegron ER (MYRBETRIQ) 25 MG TB24 tablet    Sig: Take 1 tablet (25 mg total) by mouth daily.    Dispense:  56 tablet    Refill:  0    Order Specific Question:   Supervising Provider    Answer:   Elonda Husky, LUTHER H [[id number]]     4. Screening mammogram for breast cancer Scheduled for her at [facility] for 01/23/21 at 4 pm  - MM 3D SCREEN BREAST BILATERAL; Future  5. Peri-menopause Review handout, can be tired, have headaches, hot flashes and irregular periods

## 2020-12-06 LAB — CYTOLOGY - PAP
Comment: NEGATIVE
Diagnosis: NEGATIVE
High risk HPV: NEGATIVE

## 2021-01-23 ENCOUNTER — Other Ambulatory Visit: Payer: Self-pay

## 2021-01-23 ENCOUNTER — Ambulatory Visit (HOSPITAL_COMMUNITY)
Admission: RE | Admit: 2021-01-23 | Discharge: 2021-01-23 | Disposition: A | Discharge status: Home / Self Care | Payer: 59 | Source: Ambulatory Visit | Attending: Adult Health | Admitting: Adult Health

## 2021-01-23 DIAGNOSIS — Z1231 Encounter for screening mammogram for malignant neoplasm of breast: Secondary | ICD-10-CM | POA: Insufficient documentation

## 2021-04-17 ENCOUNTER — Encounter: Payer: 59 | Admitting: Internal Medicine

## 2021-05-08 ENCOUNTER — Ambulatory Visit: Payer: Self-pay | Admitting: Internal Medicine

## 2021-05-11 ENCOUNTER — Encounter: Payer: Self-pay | Admitting: Internal Medicine

## 2021-05-11 ENCOUNTER — Ambulatory Visit (INDEPENDENT_AMBULATORY_CARE_PROVIDER_SITE_OTHER): Payer: 59 | Admitting: Internal Medicine

## 2021-05-11 ENCOUNTER — Other Ambulatory Visit: Payer: Self-pay

## 2021-05-11 VITALS — BP 142/88 | HR 97 | Resp 18 | Ht 62.0 in | Wt 189.6 lb

## 2021-05-11 DIAGNOSIS — I10 Essential (primary) hypertension: Secondary | ICD-10-CM

## 2021-05-11 DIAGNOSIS — M5442 Lumbago with sciatica, left side: Secondary | ICD-10-CM | POA: Diagnosis not present

## 2021-05-11 DIAGNOSIS — K219 Gastro-esophageal reflux disease without esophagitis: Secondary | ICD-10-CM | POA: Diagnosis not present

## 2021-05-11 DIAGNOSIS — M5441 Lumbago with sciatica, right side: Secondary | ICD-10-CM

## 2021-05-11 DIAGNOSIS — G8929 Other chronic pain: Secondary | ICD-10-CM

## 2021-05-11 MED ORDER — IBUPROFEN 600 MG PO TABS: 600.0000 mg | ORAL_TABLET | Freq: Three times a day (TID) | ORAL | 0 refills | Status: DC | PRN

## 2021-05-11 MED ORDER — OMEPRAZOLE 20 MG PO CPDR: 20.0000 mg | DELAYED_RELEASE_CAPSULE | Freq: Every day | ORAL | 3 refills | Status: DC

## 2021-05-11 NOTE — Patient Instructions (Signed)
Please take Ibuprofen as needed for back pain. Okay to take Flexeril for muscle spasm at nighttime to avoid daytime drowsiness. ? ?Please get X-ray of lumbar spine at [facility]. ? ?You are being referred for physical therapy. ?

## 2021-05-11 NOTE — Progress Notes (Signed)
? ?Acute Office Visit ? ?Subjective:  ? ? Patient ID: Julie Butler, female    DOB: [birth year], 46 y.o.   MRN: [id number] ? ?Chief Complaint  ?Patient presents with  ? Pain  ?  Pt has lower back pain and then around to front of stomach  ? ? ?HPI ?Patient is in today for complaint of low back pain, which is chronic and intermittent.  Her pain is worse with movement and radiates towards the left side.  She also complains of radiating pain towards her pelvic area/buttocks and thighs.  Denies any numbness, tingling or weakness of the LE.  She has left her job, where she had to lift weights.  She had been given Flexeril as needed for muscle spasms, but she felt drowsy with it. ? ?She also complains of LUQ abdominal discomfort/pain, but denies any nausea, vomiting, dysphagia, odynophagia, constipation, diarrhea, melena or hematochezia currently.  Denies any change in pain with food intake. ? ?Past Medical History:  ?Diagnosis Date  ? Breast nodule 12/08/2014  ? Breast pain, left 12/08/2014  ? Elevated BP 12/08/2014  ? Trichimoniasis 12/16/2014  ? Trichimoniasis 12/16/2014  ? ? ?Past Surgical History:  ?Procedure Laterality Date  ? TUBAL LIGATION    ? ? ?Family History  ?Problem Relation Age of Onset  ? Hypertension Father   ? Hyperlipidemia Father   ? Heart attack Father   ? Hyperlipidemia Mother   ? Hypertension Mother   ? Diabetes Mother   ? Stomach cancer Mother   ? Diabetes Sister   ? Hypertension Sister   ? Hyperlipidemia Sister   ? ? ?Social History  ? ?Socioeconomic History  ? Marital status: Married  ?  Spouse name: Not on file  ? Number of children: 3  ? Years of education: Not on file  ? Highest education level: Not on file  ?Occupational History  ? Not on file  ?Tobacco Use  ? Smoking status: Never  ? Smokeless tobacco: Never  ?Vaping Use  ? Vaping Use: Never used  ?Substance and Sexual Activity  ? Alcohol use: No  ? Drug use: No  ? Sexual activity: Yes  ?  Birth control/protection: Surgical  ?  Comment:  tubal  ?Other Topics Concern  ? Not on file  ?Social History Narrative  ? Not on file  ? ?Social Determinants of Health  ? ?Financial Resource Strain: Medium Risk  ? Difficulty of Paying Living Expenses: Somewhat hard  ?Food Insecurity: No Food Insecurity  ? Worried About Charity fundraiser in the Last Year: Never true  ? Ran Out of Food in the Last Year: Never true  ?Transportation Needs: No Transportation Needs  ? Lack of Transportation (Medical): No  ? Lack of Transportation (Non-Medical): No  ?Physical Activity: Inactive  ? Days of Exercise per Week: 0 days  ? Minutes of Exercise per Session: 0 min  ?Stress: No Stress Concern Present  ? Feeling of Stress : Not at all  ?Social Connections: Moderately Integrated  ? Frequency of Communication with Friends and Family: More than three times a week  ? Frequency of Social Gatherings with Friends and Family: Three times a week  ? Attends Religious Services: 1 to 4 times per year  ? Active Member of Clubs or Organizations: No  ? Attends Archivist Meetings: Never  ? Marital Status: Married  ?Intimate Partner Violence: Not At Risk  ? Fear of Current or Ex-Partner: No  ? Emotionally Abused: No  ? Physically  Abused: No  ? Sexually Abused: No  ? ? ?Outpatient Medications Prior to Visit  ?Medication Sig Dispense Refill  ? albuterol (VENTOLIN HFA) 108 (90 Base) MCG/ACT inhaler Inhale 2 puffs into the lungs every 6 (six) hours as needed for wheezing or shortness of breath. 8 g 0  ? mirabegron ER (MYRBETRIQ) 25 MG TB24 tablet Take 1 tablet (25 mg total) by mouth daily. 56 tablet 0  ? ?No facility-administered medications prior to visit.  ? ? ?No Known Allergies ? ?Review of Systems  ?Constitutional:  Negative for chills and fever.  ?HENT:  Negative for congestion, sinus pressure, sinus pain and sore throat.   ?Eyes:  Negative for pain and discharge.  ?Respiratory:  Negative for cough and shortness of breath.   ?Cardiovascular:  Negative for chest pain and  palpitations.  ?Gastrointestinal:  Positive for abdominal pain. Negative for diarrhea, nausea and vomiting.  ?Endocrine: Negative for polydipsia and polyuria.  ?Genitourinary:  Negative for dysuria and hematuria.  ?Musculoskeletal:  Positive for back pain. Negative for neck pain and neck stiffness.  ?Skin:  Negative for rash.  ?Neurological:  Negative for dizziness and weakness.  ?Psychiatric/Behavioral:  Negative for agitation and behavioral problems.   ? ?   ?Objective:  ?  ?Physical Exam ?Vitals reviewed.  ?Constitutional:   ?   General: She is not in acute distress. ?   Appearance: She is not diaphoretic.  ?HENT:  ?   Head: Normocephalic and atraumatic.  ?   Nose: Nose normal. No congestion.  ?   Mouth/Throat:  ?   Mouth: Mucous membranes are moist.  ?   Pharynx: No posterior oropharyngeal erythema.  ?Eyes:  ?   General: No scleral icterus. ?   Extraocular Movements: Extraocular movements intact.  ?   Pupils: Pupils are equal, round, and reactive to light.  ?Cardiovascular:  ?   Rate and Rhythm: Normal rate and regular rhythm.  ?   Pulses: Normal pulses.  ?   Heart sounds: Normal heart sounds. No murmur heard. ?Pulmonary:  ?   Breath sounds: Normal breath sounds. No wheezing or rales.  ?Abdominal:  ?   Palpations: Abdomen is soft.  ?   Tenderness: There is abdominal tenderness (LUQ, mild).  ?Musculoskeletal:     ?   General: Tenderness (Lumber spine area) present. No swelling.  ?   Cervical back: Neck supple. No tenderness.  ?   Right lower leg: No edema.  ?   Left lower leg: No edema.  ?Skin: ?   General: Skin is warm.  ?   Findings: No rash.  ?Neurological:  ?   General: No focal deficit present.  ?   Mental Status: She is alert and oriented to person, place, and time.  ?   Cranial Nerves: No cranial nerve deficit.  ?   Sensory: No sensory deficit.  ?   Motor: No weakness.  ?Psychiatric:     ?   Mood and Affect: Mood normal.     ?   Behavior: Behavior normal.  ? ? ?BP (!) 142/88 (BP Location: Right Arm,  Patient Position: Sitting, Cuff Size: Normal)   Pulse 97   Resp 18   Ht '5\' 2"'$  (1.575 m)   Wt 189 lb 9.6 oz (86 kg)   SpO2 98%   BMI 34.68 kg/m?  ?Wt Readings from Last 3 Encounters:  ?05/11/21 189 lb 9.6 oz (86 kg)  ?12/01/20 188 lb (85.3 kg)  ?10/12/20 185 lb 1.9 oz (84 kg)  ? ? ? ?   ?  Assessment & Plan:  ? ?Problem List Items Addressed This Visit   ? ?  ? Cardiovascular and Mediastinum  ? Hypertension  ?  BP Readings from Last 1 Encounters:  ?05/11/21 (!) 142/88  ?Well-controlled at home ?Discontinued amlodipine in the last visit as her BP was well controlled without it ?Advised DASH diet and moderate exercise/walking, at least 150 mins/week ? ?  ?  ?  ? Digestive  ? Gastroesophageal reflux disease  ?  Has epigastric and LUQ pain, unclear whether gastritis vs referred pain from back ?Will give trial of PPI ?  ?  ? Relevant Medications  ? omeprazole (PRILOSEC) 20 MG capsule  ?  ? Other  ? 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  Advised to avoid heavy weight ?Trial of back brace ?Ibuprofen PRN ?Flexeril PRN, but she gets drowsy with it, advised to take it qHS PRN ?Check X-ray lumbar spine ?  ?  ? Relevant Medications  ? ibuprofen (ADVIL) 600 MG tablet  ? Other Relevant Orders  ? DG Lumbar Spine Complete  ? Ambulatory referral to Physical Therapy  ? ? ? ?Meds ordered this encounter  ?Medications  ? ibuprofen (ADVIL) 600 MG tablet  ?  Sig: Take 1 tablet (600 mg total) by mouth every 8 (eight) hours as needed.  ?  Dispense:  30 tablet  ?  Refill:  0  ? omeprazole (PRILOSEC) 20 MG capsule  ?  Sig: Take 1 capsule (20 mg total) by mouth daily.  ?  Dispense:  30 capsule  ?  Refill:  3  ? ? ? ?Lindell Spar, MD ?

## 2021-05-11 NOTE — Assessment & Plan Note (Signed)
Has epigastric and LUQ pain, unclear whether gastritis vs referred pain from back ?Will give trial of PPI ?

## 2021-05-11 NOTE — Assessment & Plan Note (Signed)
BP Readings from Last 1 Encounters:  ?05/11/21 (!) 142/88  ? ?Well-controlled at home ?Discontinued amlodipine in the last visit as her BP was well controlled without it ?Advised DASH diet and moderate exercise/walking, at least 150 mins/week ? ?

## 2021-05-11 NOTE — Assessment & Plan Note (Signed)
Advised to avoid heavy weight ?Trial of back brace ?Ibuprofen PRN ?Flexeril PRN, but she gets drowsy with it, advised to take it qHS PRN ?Check X-ray lumbar spine ?

## 2021-05-25 ENCOUNTER — Other Ambulatory Visit: Payer: Self-pay

## 2021-05-25 ENCOUNTER — Ambulatory Visit (HOSPITAL_COMMUNITY)
Admission: RE | Admit: 2021-05-25 | Discharge: 2021-05-25 | Disposition: A | Discharge status: Home / Self Care | Payer: 59 | Source: Ambulatory Visit | Attending: Internal Medicine | Admitting: Internal Medicine

## 2021-05-25 DIAGNOSIS — G8929 Other chronic pain: Secondary | ICD-10-CM | POA: Diagnosis present

## 2021-05-25 DIAGNOSIS — M5441 Lumbago with sciatica, right side: Secondary | ICD-10-CM | POA: Diagnosis present

## 2021-05-25 DIAGNOSIS — M5442 Lumbago with sciatica, left side: Secondary | ICD-10-CM | POA: Diagnosis present

## 2021-05-29 ENCOUNTER — Encounter: Payer: Self-pay | Admitting: Internal Medicine

## 2021-06-05 ENCOUNTER — Encounter (HOSPITAL_COMMUNITY): Payer: Self-pay | Admitting: Physical Therapy

## 2021-06-05 ENCOUNTER — Ambulatory Visit (HOSPITAL_COMMUNITY): Payer: 59 | Attending: Internal Medicine | Admitting: Physical Therapy

## 2021-06-05 DIAGNOSIS — M5459 Other low back pain: Secondary | ICD-10-CM | POA: Insufficient documentation

## 2021-06-05 DIAGNOSIS — G8929 Other chronic pain: Secondary | ICD-10-CM | POA: Insufficient documentation

## 2021-06-05 DIAGNOSIS — M546 Pain in thoracic spine: Secondary | ICD-10-CM | POA: Diagnosis present

## 2021-06-05 DIAGNOSIS — M5442 Lumbago with sciatica, left side: Secondary | ICD-10-CM | POA: Insufficient documentation

## 2021-06-05 DIAGNOSIS — M5441 Lumbago with sciatica, right side: Secondary | ICD-10-CM | POA: Insufficient documentation

## 2021-06-05 NOTE — Therapy (Signed)
?OUTPATIENT PHYSICAL THERAPY THORACOLUMBAR EVALUATION ? ? ?Patient Name: Julie Butler ?MRN: [id number] ?DOB:1975-11-28, 46 y.o., female ?Today's Date: 06/05/2021 ? ? PT End of Session - 06/05/21 1726   ? ? Visit Number 1   ? Number of Visits 8   ? Date for PT Re-Evaluation 07/03/21   ? Authorization Type [employer] (30 VL)   ? PT Start Time 4098   ? PT Stop Time 1191   ? PT Time Calculation (min) 50 min   ? Activity Tolerance Patient tolerated treatment well   ? Behavior During Therapy [facility] for tasks assessed/performed   ? ?  ?  ? ?  ? ? ?Past Medical History:  ?Diagnosis Date  ? Breast nodule 12/08/2014  ? Breast pain, left 12/08/2014  ? Elevated BP 12/08/2014  ? Trichimoniasis 12/16/2014  ? Trichimoniasis 12/16/2014  ? ?Past Surgical History:  ?Procedure Laterality Date  ? TUBAL LIGATION    ? ?Patient Active Problem List  ? Diagnosis Date Noted  ? Gastroesophageal reflux disease 05/11/2021  ? Chest pain 10/12/2020  ? Annual physical exam 06/13/2020  ? Migraine 06/13/2020  ? 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 Neck pain 03/14/2020  ? Prediabetes 12/07/2019  ? Obesity (BMI 30.0-34.9) 12/07/2019  ? 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 Breast nodule 12/08/2014  ? Breast pain, left 12/08/2014  ? Hypertension 12/08/2014  ? ? ?PCP: Lindell Spar, MD ? ?REFERRING PROVIDER: Lindell Spar, MD ? ?REFERRING DIAG: M54.41,M54.42,G89.29 (ICD-10-CM) - Chronic midline low back pain with bilateral sciatica  ? ?THERAPY DIAG:  ?Other low back pain ? ?Pain in thoracic spine ? ?ONSET DATE: February ? ?SUBJECTIVE:                                                                                                                                                                                          ? ?SUBJECTIVE STATEMENT: ?Patient presents to therapy with complaint of low back pain. This began about 2 months ago. Pain is worst with prolonged sitting. Also reports pain in both shoulder and chest.  There was no mechanism of injury. No treatment at this point. She takes ibuprofen PRN.   ? ?PERTINENT HISTORY:  ?None ? ?PAIN:  ?Are you having pain? Yes: NPRS scale: 5/10 ?Pain location: mid and low back ?Pain description: sore/ bruised  ?Aggravating factors: prolonged sitting ?Relieving factors: unsure  ? ? ?PRECAUTIONS: None ? ?WEIGHT BEARING RESTRICTIONS No ? ?FALLS:  ?Has patient fallen in last 6 months? No ? ?LIVING ENVIRONMENT: ?Lives with: lives with their family and lives with their spouse ?Lives in: House/apartment ? 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 ?PLOF: Independent ? ?PATIENT GOALS get rid of back pain  ? ? ?OBJECTIVE:  ? ?DIAGNOSTIC FINDINGS:  ?IMPRESSION: ?1. Minimal degenerative changes. ?2. Possible bilateral pars defect at L5 ?  ? ?PATIENT SURVEYS:  ?FOTO 53% function   ? ? ?COGNITION: ? Overall cognitive status: Within functional limits for tasks assessed   ?  ? ?POSTURE:  ?Rounded shoulders ? ?PALPATION: ?Min/ mod TTP about lumbar and thoracic paraspinals  ? ?LUMBAR ROM:  ? ?Active  A/PROM  ?06/05/2021  ?Flexion WFL  ?Extension 25% limited  ?Right lateral flexion WFL  ?Left lateral flexion WFL  ?WFL  ?Right rotation WFL  ?Left rotation WFL  ? (Blank rows = not tested) ? ? ?LE MMT: ? ?MMT Right ?06/05/2021 Left ?06/05/2021  ?Hip flexion 5 5  ?Hip extension 4- 4-  ?Hip abduction 4+ 4+  ?Hip adduction    ?Hip internal rotation    ?Hip external rotation    ?Knee flexion    ?Knee extension 5 5  ?Ankle dorsiflexion 5 5  ?Ankle plantarflexion    ?Ankle inversion    ?Ankle eversion    ? (Blank rows = not tested) ? ? ? ?TODAY'S TREATMENT  ?06/05/21 ?Open book stretch  ?Supine scap retraction  ?LTR  ? ? ?PATIENT EDUCATION:  ?Education details: on evaluation findings, POC and HEP  ?Person educated: Patient and Child(ren) ?Education method: Explanation and Demonstration ?Education comprehension: verbalized understanding and returned demonstration ? ? ?HOME EXERCISE PROGRAM: ?Access Code: [email or web address] ?URL:  [email or web address] ?Date: 06/05/2021 ?Prepared by: Josue Hector ? ?Exercises ?- Sidelying Thoracic and Shoulder Rotation  - 3 x daily - 7 x weekly - 1 sets - 10 reps - 5-10 second hold ?- Supine Lower Trunk Rotation  - 3 x daily - 7 x weekly - 1 sets - 10 reps - 5-10 second hold ?- Supine Scapular Retraction  - 3 x daily - 7 x weekly - 1 sets - 10 reps - 5-10 second hold ? ?ASSESSMENT: ? ?CLINICAL IMPRESSION: ?Patient is a 46 y.o. female who presents to physical therapy with complaint of low and mid back pain. Patient demonstrates muscle weakness, reduced ROM, and fascial restrictions which are likely contributing to symptoms of pain and are negatively impacting patient ability to perform ADLs and functional mobility tasks. Patient will benefit from skilled physical therapy services to address these deficits to reduce pain and improve level of function with ADLs and functional mobility tasks. ? ? ? ?OBJECTIVE IMPAIRMENTS decreased activity tolerance, decreased mobility, decreased ROM, decreased strength, increased fascial restrictions, impaired flexibility, improper body mechanics, postural dysfunction, and pain.  ? ?ACTIVITY LIMITATIONS cleaning, community activity, driving, meal prep, occupation, laundry, yard work, shopping, and yard work.  ? ?PERSONAL FACTORS  none  are also affecting patient's functional outcome.  ? ? ?REHAB POTENTIAL: Good ? ?CLINICAL DECISION MAKING: Stable/uncomplicated ? ?EVALUATION COMPLEXITY: Low ? ? ?GOALS: ?SHORT TERM GOALS: Target date: 06/19/2021 ? ?Patient will be independent with initial HEP and self-management strategies to improve functional outcomes ?Baseline:  ?Goal status: INITIAL  ? ? ?LONG TERM GOALS: Target date: 07/03/2021 ? ?Patient will be independent with advanced HEP and self-management strategies to improve functional outcomes ?Baseline:  ?Goal status: INITIAL ? ?2.  Patient will improve FOTO score to predicted value to indicate improvement in  functional outcomes ?Baseline:  ?Goal status: INITIAL ? ?3.  Patient will report reduction of back pain to <3/10 for improved quality of life and ability to perform ADLs  ?Baseline:  ?  Goal status: INITIAL ? ?4. Patient will have equal to or > 4+/5 MMT throughout BLE to improve ability to perform functional mobility, stair ambulation and ADLs.  ?Baseline:  ?Goal status: INITIAL ? ? ? ?PLAN: ?PT FREQUENCY: 2x/week ? ?PT DURATION: 4 weeks ? ?PLANNED INTERVENTIONS: Therapeutic exercises, Therapeutic activity, Neuromuscular re-education, Balance training, Gait training, Patient/Family education, Joint manipulation, Joint mobilization, Stair training, Aquatic Therapy, Dry Needling, Electrical stimulation, Spinal manipulation, Spinal mobilization, Cryotherapy, Moist heat, scar mobilization, Taping, Traction, Ultrasound, Biofeedback, Ionotophoresis '4mg'$ /ml Dexamethasone, and Manual therapy. ?. ? ?PLAN FOR NEXT SESSION: Progress lumbar and thoracic mobility. Core and scapular strengthening. Manual STM as needed.  ? ?5:28 PM, 06/05/21 ?Josue Hector PT DPT  ?Physical Therapist with [place]  ?[facility]  ?[phone] ? ?

## 2021-06-12 ENCOUNTER — Ambulatory Visit (HOSPITAL_COMMUNITY): Payer: 59

## 2021-06-12 DIAGNOSIS — M5459 Other low back pain: Secondary | ICD-10-CM

## 2021-06-12 DIAGNOSIS — M546 Pain in thoracic spine: Secondary | ICD-10-CM

## 2021-06-12 NOTE — Therapy (Signed)
?OUTPATIENT PHYSICAL THERAPY TREATMENT NOTE ? ? ?Patient Name: Julie Butler ?MRN: [id number] ?DOB:[birth year], 46 y.o., female ?Today's Date: 06/12/2021 ? ?PCP: Lindell Spar, MD ?REFERRING PROVIDER: Lindell Spar, MD ? ?END OF SESSION:  ? PT End of Session - 06/12/21 1651   ? ? Visit Number 2   ? Number of Visits 8   ? Date for PT Re-Evaluation 07/03/21   ? Authorization Type [employer] (30 VL)   ? PT Start Time 9470   ? PT Stop Time 1730   ? PT Time Calculation (min) 40 min   ? Activity Tolerance Patient tolerated treatment well   ? Behavior During Therapy [facility] for tasks assessed/performed   ? ?  ?  ? ?  ? ? ?Past Medical History:  ?Diagnosis Date  ? Breast nodule 12/08/2014  ? Breast pain, left 12/08/2014  ? Elevated BP 12/08/2014  ? Trichimoniasis 12/16/2014  ? Trichimoniasis 12/16/2014  ? ?Past Surgical History:  ?Procedure Laterality Date  ? TUBAL LIGATION    ? ?Patient Active Problem List  ? Diagnosis Date Noted  ? Gastroesophageal reflux disease 05/11/2021  ? Chest pain 10/12/2020  ? Annual physical exam 06/13/2020  ? Migraine 06/13/2020  ? 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 Neck pain 03/14/2020  ? Prediabetes 12/07/2019  ? Obesity (BMI 30.0-34.9) 12/07/2019  ? 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 Breast nodule 12/08/2014  ? Breast pain, left 12/08/2014  ? Hypertension 12/08/2014  ? ? ?REFERRING DIAG: low back pain ? ?THERAPY DIAG:  ?Other low back pain ? ?Pain in thoracic spine ? ?PERTINENT HISTORY: none ? ?PRECAUTIONS: none ? ?SUBJECTIVE: patient reports no changes since last visit, interpreter is present with her. She reports she seems to have more pain on the Left side than the Right side.  ? ?PAIN:  ?Are you having pain? No, not right now ? ? ? ? ?OBJECTIVE:  ?  ?DIAGNOSTIC FINDINGS:  ?IMPRESSION: ?1. Minimal degenerative changes. ?2. Possible bilateral pars defect at L5 ?  ?  ?PATIENT SURVEYS:  ?FOTO 53% function   ?  ?  ?COGNITION: ?          Overall  cognitive status: Within functional limits for tasks assessed               ?           ?  ?POSTURE:  ?Rounded shoulders ?  ?PALPATION: ?Min/ mod TTP about lumbar and thoracic paraspinals  ?  ?LUMBAR ROM:  ?  ?Active  A/PROM  ?06/05/2021  ?Flexion WFL  ?Extension 25% limited  ?Right lateral flexion WFL  ?Left lateral flexion WFL  ?WFL  ?Right rotation WFL  ?Left rotation WFL  ? (Blank rows = not tested) ?  ?  ?LE MMT: ?  ?MMT Right ?06/05/2021 Left ?06/05/2021  ?Hip flexion 5 5  ?Hip extension 4- 4-  ?Hip abduction 4+ 4+  ?Hip adduction      ?Hip internal rotation      ?Hip external rotation      ?Knee flexion      ?Knee extension 5 5  ?Ankle dorsiflexion 5 5  ?Ankle plantarflexion      ?Ankle inversion      ?Ankle eversion      ? (Blank rows = not tested) ?  ?  ?  ?TODAY'S TREATMENT  ?06/12/21 ?Review of HEP and goals ?Open book stretch x 10; right sidelying  ?LTR x 10 ?Scap retraction x  10 ?Bridge x 10 ?Hip Add with ball x 10 ?Hip ABD with belt isometric x 10 ? ? ?06/05/21 ?Open book stretch  ?Supine scap retraction  ?LTR  ?  ?  ?PATIENT EDUCATION:  ?Education details: on evaluation findings, POC and HEP  ?Person educated: Patient and Child(ren) ?Education method: Explanation and Demonstration ?Education comprehension: verbalized understanding and returned demonstration ?  ?  ?HOME EXERCISE PROGRAM: ?Bridge ?Hip ADD ?Hip ABD ? ? ?Access Code: [email or web address] ?URL: [email or web address] ?Date: 06/05/2021 ?Prepared by: Josue Hector ?  ?Exercises ?- Sidelying Thoracic and Shoulder Rotation  - 3 x daily - 7 x weekly - 1 sets - 10 reps - 5-10 second hold ?- Supine Lower Trunk Rotation  - 3 x daily - 7 x weekly - 1 sets - 10 reps - 5-10 second hold ?- Supine Scapular Retraction  - 3 x daily - 7 x weekly - 1 sets - 10 reps - 5-10 second hold ?  ?ASSESSMENT: ?  ?CLINICAL IMPRESSION: ?Today's session focused on review of HEP and goals set at evaluation and progression of core and hip strengthening exercises.  Therapist  added bridge, hip ADD and hip ABD; HEP updated. Patient reports no increased pain with exercise but does state she feels pulling and fatigue.   Patient will benefit from skilled physical therapy services to address these deficits to reduce pain and improve level of function with ADLs and functional mobility tasks. ?  ?  ?  ?OBJECTIVE IMPAIRMENTS decreased activity tolerance, decreased mobility, decreased ROM, decreased strength, increased fascial restrictions, impaired flexibility, improper body mechanics, postural dysfunction, and pain.  ?  ?ACTIVITY LIMITATIONS cleaning, community activity, driving, meal prep, occupation, laundry, yard work, shopping, and yard work.  ?  ?PERSONAL FACTORS  none  are also affecting patient's functional outcome.  ?  ?  ?REHAB POTENTIAL: Good ?  ?CLINICAL DECISION MAKING: Stable/uncomplicated ?  ?EVALUATION COMPLEXITY: Low ?  ?  ?GOALS: ?SHORT TERM GOALS: Target date: 06/19/2021 ?  ?Patient will be independent with initial HEP and self-management strategies to improve functional outcomes ?Baseline:  ?Goal status: ongoing ?  ?  ?LONG TERM GOALS: Target date: 07/03/2021 ?  ?Patient will be independent with advanced HEP and self-management strategies to improve functional outcomes ?Baseline:  ?Goal status: ongoing ?  ?2.  Patient will improve FOTO score to predicted value to indicate improvement in functional outcomes ?Baseline:  ?Goal status: ongoing ?  ?3.  Patient will report reduction of back pain to <3/10 for improved quality of life and ability to perform ADLs  ?Baseline:  ?Goal status: ongoing ?  ?4. Patient will have equal to or > 4+/5 MMT throughout BLE to improve ability to perform functional mobility, stair ambulation and ADLs.  ?Baseline:  ?Goal status: ongoing  ?  ?  ?PLAN: ?PT FREQUENCY: 2x/week ?  ?PT DURATION: 4 weeks ?  ?PLANNED INTERVENTIONS: Therapeutic exercises, Therapeutic activity, Neuromuscular re-education, Balance training, Gait training, Patient/Family education,  Joint manipulation, Joint mobilization, Stair training, Aquatic Therapy, Dry Needling, Electrical stimulation, Spinal manipulation, Spinal mobilization, Cryotherapy, Moist heat, scar mobilization, Taping, Traction, Ultrasound, Biofeedback, Ionotophoresis '4mg'$ /ml Dexamethasone, and Manual therapy. ?. ?  ?PLAN FOR NEXT SESSION: Progress lumbar and thoracic mobility. Core and scapular strengthening. Manual STM as needed. try seated thoracic extension; scap rows and ext. ? ? ? ?5:35 PM, 06/12/21 ?Antanette Richwine Small Kyna Blahnik MPT ?[facility] ?[place] [phone] ?Ph:[phone] ? ?  ? ?

## 2021-06-14 ENCOUNTER — Ambulatory Visit (HOSPITAL_COMMUNITY): Payer: 59 | Admitting: Physical Therapy

## 2021-06-14 ENCOUNTER — Encounter (HOSPITAL_COMMUNITY): Payer: Self-pay | Admitting: Physical Therapy

## 2021-06-14 DIAGNOSIS — M546 Pain in thoracic spine: Secondary | ICD-10-CM

## 2021-06-14 DIAGNOSIS — M5459 Other low back pain: Secondary | ICD-10-CM

## 2021-06-14 NOTE — Therapy (Signed)
?OUTPATIENT PHYSICAL THERAPY TREATMENT NOTE ? ? ?Patient Name: Julie Butler ?MRN: [id number] ?DOB:December 19, 1975, 46 y.o., female ?Today's Date: 06/14/2021 ? ?PCP: Lindell Spar, MD ?REFERRING PROVIDER: Lindell Spar, MD ? ?END OF SESSION:  ? ? ? ?Past Medical History:  ?Diagnosis Date  ? Breast nodule 12/08/2014  ? Breast pain, left 12/08/2014  ? Elevated BP 12/08/2014  ? Trichimoniasis 12/16/2014  ? Trichimoniasis 12/16/2014  ? ?Past Surgical History:  ?Procedure Laterality Date  ? TUBAL LIGATION    ? ?Patient Active Problem List  ? Diagnosis Date Noted  ? Gastroesophageal reflux disease 05/11/2021  ? Chest pain 10/12/2020  ? Annual physical exam 06/13/2020  ? Migraine 06/13/2020  ? 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 Neck pain 03/14/2020  ? Prediabetes 12/07/2019  ? Obesity (BMI 30.0-34.9) 12/07/2019  ? 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 Breast nodule 12/08/2014  ? Breast pain, left 12/08/2014  ? Hypertension 12/08/2014  ? ? ?REFERRING DIAG: low back pain ? ?THERAPY DIAG:  ?Other low back pain ? ?Pain in thoracic spine ? ?PERTINENT HISTORY: none ? ?PRECAUTIONS: none ? ?SUBJECTIVE: Pt accompanied by interpreter, Cresenciano Genre ?patient reports very little pain, states she planted a garden today.  Reports compliance with HEP and feeling looser ? ?PAIN:  ?Are you having pain? No, not right now ? ? ? ? ?OBJECTIVE:  ?  ? Below measures assessed at initial evaluation on 06/05/21: ?DIAGNOSTIC FINDINGS:  ?IMPRESSION: ?1. Minimal degenerative changes. ?2. Possible bilateral pars defect at L5 ?  ?  ?PATIENT SURVEYS:  ?FOTO 53% function   ?  ?  ?COGNITION: ?          Overall cognitive status: Within functional limits for tasks assessed               ?           ?  ?POSTURE:  ?Rounded shoulders ?  ?PALPATION: ?Min/ mod TTP about lumbar and thoracic paraspinals  ?  ?LUMBAR ROM:  ?  ?Active  A/PROM  ?06/05/2021  ?Flexion WFL  ?Extension 25% limited  ?Right lateral flexion WFL  ?Left lateral  flexion WFL  ?WFL  ?Right rotation WFL  ?Left rotation WFL  ? (Blank rows = not tested) ?  ?  ?LE MMT: ?  ?MMT Right ?06/05/2021 Left ?06/05/2021  ?Hip flexion 5 5  ?Hip extension 4- 4-  ?Hip abduction 4+ 4+  ?Hip adduction      ?Hip internal rotation      ?Hip external rotation      ?Knee flexion      ?Knee extension 5 5  ?Ankle dorsiflexion 5 5  ?Ankle plantarflexion      ?Ankle inversion      ?Ankle eversion      ? (Blank rows = not tested) ?  ?  ?  ?TODAY'S TREATMENT  ? ?06/14/21 ?Seated: Wback 10X ? UE flexion 10X ? Sit to stand 10X no UE standard height ?Supine: LTR 10X ? Bridge 2X10 ? SLR 10X each ? [place] 3X30" each ? Hamstring stretch with towel 3X30" ?Sidelying: Hip abduction ?Prone: Hip extension 10X each ? ? ?Open book stretch x 10; right sidelying  ?LTR x 10 ?Scap retraction x 10 ?Hip Add with ball x 10 ?Hip ABD with belt isometric x 10 ? ?06/12/21 ?Review of HEP and goals ?Open book stretch x 10; right sidelying  ?LTR x 10 ?Scap retraction x 10 ?Bridge x 10 ?Hip Add with ball x  10 ?Hip ABD with belt isometric x 10 ? ? ?06/05/21 ?Open book stretch  ?Supine scap retraction  ?LTR  ?  ?  ?PATIENT EDUCATION:  ?Education details: 06/05/21 on evaluation findings, POC and HEP ; 4/12: continuation of exercises, core stability ?Person educated: Patient and Child(ren) ?Education method: Explanation and Demonstration ?Education comprehension: verbalized understanding and returned demonstration ?  ?  ?HOME EXERCISE PROGRAM: ?  06/14/21: Given at evaluation: Bridge, hip add, hip abd ? ?Evaluation 06/05/21:  Access Code: [email or web address] ?URL: [email or web address] ?Date: 06/05/2021 ?Prepared by: Josue Hector ?  ?Exercises ?- Sidelying Thoracic and Shoulder Rotation  - 3 x daily - 7 x weekly - 1 sets - 10 reps - 5-10 second hold ?- Supine Lower Trunk Rotation  - 3 x daily - 7 x weekly - 1 sets - 10 reps - 5-10 second hold ?- Supine Scapular Retraction  - 3 x daily - 7 x weekly - 1 sets - 10 reps - 5-10 second hold ?   ?ASSESSMENT: ?  ?CLINICAL IMPRESSION: ?Continued focus on improving core stab and LE strength. Increased challenge of hip abduction in side lying and added hip extension in prone this session.  Pt required extra cues to complete in correct form.  KTC stretch added as well as hamstring stretch in supine.  Pt reported positive results from therex and no increased pain. Patient will benefit from skilled physical therapy services to address these deficits to reduce pain and improve level of function with ADLs and functional mobility tasks. ?  ?  ?  ?OBJECTIVE IMPAIRMENTS decreased activity tolerance, decreased mobility, decreased ROM, decreased strength, increased fascial restrictions, impaired flexibility, improper body mechanics, postural dysfunction, and pain.  ?  ?ACTIVITY LIMITATIONS cleaning, community activity, driving, meal prep, occupation, laundry, yard work, shopping, and yard work.  ?  ?PERSONAL FACTORS  none  are also affecting patient's functional outcome.  ?  ?  ?  ?GOALS: ?SHORT TERM GOALS: Target date: 06/19/2021 ?  ?Patient will be independent with initial HEP and self-management strategies to improve functional outcomes ?Baseline:  ?Goal status: ongoing ?  ?  ?LONG TERM GOALS: Target date: 07/03/2021 ?  ?Patient will be independent with advanced HEP and self-management strategies to improve functional outcomes ?Baseline:  ?Goal status: ongoing ?  ?2.  Patient will improve FOTO score to predicted value to indicate improvement in functional outcomes ?Baseline:  ?Goal status: ongoing ?  ?3.  Patient will report reduction of back pain to <3/10 for improved quality of life and ability to perform ADLs  ?Baseline:  ?Goal status: ongoing ?  ?4. Patient will have equal to or > 4+/5 MMT throughout BLE to improve ability to perform functional mobility, stair ambulation and ADLs.  ?Baseline:  ?Goal status: ongoing  ?  ?  ?PLAN: ?PT FREQUENCY: 2x/week ?  ?PT DURATION: 4 weeks ?  ?PLANNED INTERVENTIONS: Therapeutic  exercises, Therapeutic activity, Neuromuscular re-education, Balance training, Gait training, Patient/Family education, Joint manipulation, Joint mobilization, Stair training, Aquatic Therapy, Dry Needling, Electrical stimulation, Spinal manipulation, Spinal mobilization, Cryotherapy, Moist heat, scar mobilization, Taping, Traction, Ultrasound, Biofeedback, Ionotophoresis '4mg'$ /ml Dexamethasone, and Manual therapy. ?. ?  ?PLAN FOR NEXT SESSION: Progress lumbar and thoracic mobility. Core and scapular strengthening. Manual STM as needed. Next session try theraband postural strengthening (thoracic extension; scap rows and ext). ? ? ? ?4:21 PM, 06/14/21 ?Anecia Nusbaum Sula Soda, PTA/CLT, WTA ?[phone] ? ?  ? ?

## 2021-06-19 ENCOUNTER — Encounter (HOSPITAL_COMMUNITY): Payer: 59 | Admitting: Physical Therapy

## 2021-06-19 ENCOUNTER — Telehealth (HOSPITAL_COMMUNITY): Payer: Self-pay | Admitting: Physical Therapy

## 2021-06-21 ENCOUNTER — Encounter (HOSPITAL_COMMUNITY): Payer: Self-pay | Admitting: Physical Therapy

## 2021-06-21 ENCOUNTER — Ambulatory Visit (HOSPITAL_COMMUNITY): Payer: 59 | Admitting: Physical Therapy

## 2021-06-21 DIAGNOSIS — M5459 Other low back pain: Secondary | ICD-10-CM

## 2021-06-21 DIAGNOSIS — M546 Pain in thoracic spine: Secondary | ICD-10-CM

## 2021-06-21 NOTE — Therapy (Signed)
?OUTPATIENT PHYSICAL THERAPY TREATMENT NOTE ? ? ?Patient Name: Julie Butler ?MRN: [id number] ?DOB:05-02-1975, 46 y.o., female ?Today's Date: 06/21/2021 ? ?PCP: Lindell Spar, MD ?REFERRING PROVIDER: Lindell Spar, MD ? ?END OF SESSION:  ? PT End of Session - 06/21/21 1645   ? ? Visit Number 4   ? Number of Visits 8   ? Date for PT Re-Evaluation 07/03/21   ? Authorization Type [employer] (30 VL)   ? PT Start Time 8676   ? PT Stop Time 7209   ? PT Time Calculation (min) 40 min   ? Activity Tolerance Patient tolerated treatment well   ? Behavior During Therapy [facility] for tasks assessed/performed   ? ?  ?  ? ?  ? ? ? ?Past Medical History:  ?Diagnosis Date  ? Breast nodule 12/08/2014  ? Breast pain, left 12/08/2014  ? Elevated BP 12/08/2014  ? Trichimoniasis 12/16/2014  ? Trichimoniasis 12/16/2014  ? ?Past Surgical History:  ?Procedure Laterality Date  ? TUBAL LIGATION    ? ?Patient Active Problem List  ? Diagnosis Date Noted  ? Gastroesophageal reflux disease 05/11/2021  ? Chest pain 10/12/2020  ? Annual physical exam 06/13/2020  ? Migraine 06/13/2020  ? 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 Neck pain 03/14/2020  ? Prediabetes 12/07/2019  ? Obesity (BMI 30.0-34.9) 12/07/2019  ? 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 Breast nodule 12/08/2014  ? Breast pain, left 12/08/2014  ? Hypertension 12/08/2014  ? ? ?REFERRING DIAG: low back pain ? ?THERAPY DIAG:  ?Other low back pain ? ?Pain in thoracic spine ? ?PERTINENT HISTORY: none ? ?PRECAUTIONS: none ? ?SUBJECTIVE: Patient says exercises do not hurt as much now, no pain currently but her back feels tired.  ? ?Pt accompanied by interpreter Alleen Borne  ?patient reports very little pain, states she planted a garden today.  Reports compliance with HEP and feeling looser ? ?PAIN:  ?Are you having pain? No, not right now ? ? ? ? ?OBJECTIVE:  ?  ? Below measures assessed at initial evaluation on 06/05/21: ?DIAGNOSTIC FINDINGS:  ?IMPRESSION: ?1.  Minimal degenerative changes. ?2. Possible bilateral pars defect at L5 ?  ?  ?PATIENT SURVEYS:  ?FOTO 53% function   ?  ?  ?COGNITION: ?          Overall cognitive status: Within functional limits for tasks assessed               ?           ?  ?POSTURE:  ?Rounded shoulders ?  ?PALPATION: ?Min/ mod TTP about lumbar and thoracic paraspinals  ?  ?LUMBAR ROM:  ?  ?Active  A/PROM  ?06/05/2021  ?Flexion WFL  ?Extension 25% limited  ?Right lateral flexion WFL  ?Left lateral flexion WFL  ?WFL  ?Right rotation WFL  ?Left rotation WFL  ? (Blank rows = not tested) ?  ?  ?LE MMT: ?  ?MMT Right ?06/05/2021 Left ?06/05/2021  ?Hip flexion 5 5  ?Hip extension 4- 4-  ?Hip abduction 4+ 4+  ?Hip adduction      ?Hip internal rotation      ?Hip external rotation      ?Knee flexion      ?Knee extension 5 5  ?Ankle dorsiflexion 5 5  ?Ankle plantarflexion      ?Ankle inversion      ?Ankle eversion      ? (Blank rows = not tested) ?  ?  ?  ?  TODAY'S TREATMENT  ? ?06/21/21 ?SKTC 10 x 5" ?LTR 10 x 5"  ?Open book stretch 10 x 5" each  ?Bridge x20  ?SLR 2 x 10 each  ?Sidelying leg raise 2 x10  ? ?Band rows RTB 2 x 10  ?Band shoulder extension RTB 2 x 10 ? ? ?06/14/21 ?Seated: Wback 10X ? UE flexion 10X ? Sit to stand 10X no UE standard height ?Supine: LTR 10X ? Bridge 2X10 ? SLR 10X each ? [place] 3X30" each ? Hamstring stretch with towel 3X30" ?Sidelying: Hip abduction ?Prone: Hip extension 10X each ? ? ?Open book stretch x 10; right sidelying  ?LTR x 10 ?Scap retraction x 10 ?Hip Add with ball x 10 ?Hip ABD with belt isometric x 10 ? ?  ?PATIENT EDUCATION:  ?Education details: 06/05/21 on evaluation findings, POC and HEP ; 4/12: continuation of exercises, core stability ?Person educated: Patient and Child(ren) ?Education method: Explanation and Demonstration ?Education comprehension: verbalized understanding and returned demonstration ?  ?  ?HOME EXERCISE PROGRAM: ?  06/21/21 ?Access Code: [email or web address] ?URL: [email or web address] ?Exercises ?-  Sidelying Hip Abduction   ?- Standing Shoulder Row with Anchored Resistance   ?- Shoulder extension with resistance  ? ?06/14/21:  ?Given at evaluation: Bridge, hip add, hip abd ? ?Evaluation 06/05/21:  Access Code: [email or web address] ?URL: [email or web address] ?Date: 06/05/2021 ?Prepared by: Josue Hector ?  ?Exercises ?- Sidelying Thoracic and Shoulder Rotation  - 3 x daily - 7 x weekly - 1 sets - 10 reps - 5-10 second hold ?- Supine Lower Trunk Rotation  - 3 x daily - 7 x weekly - 1 sets - 10 reps - 5-10 second hold ?- Supine Scapular Retraction  - 3 x daily - 7 x weekly - 1 sets - 10 reps - 5-10 second hold ?  ?ASSESSMENT: ?  ?CLINICAL IMPRESSION: ?Progressed hip and postural strengthening with good tolerance overall. Patient noting increased muscle fatigue with increased repetitions. Added band rows and extensions for postural and core strengthening. Patient educated on proper form and purpose of all added exercises. Issued updated HEP and red theraband. Patient will continue to benefit from skilled therapy services to reduce remaining deficits and improve functional ability.  ? ?   ?OBJECTIVE IMPAIRMENTS decreased activity tolerance, decreased mobility, decreased ROM, decreased strength, increased fascial restrictions, impaired flexibility, improper body mechanics, postural dysfunction, and pain.  ?  ?ACTIVITY LIMITATIONS cleaning, community activity, driving, meal prep, occupation, laundry, yard work, shopping, and yard work.  ?  ?PERSONAL FACTORS  none  are also affecting patient's functional outcome.  ?  ?  ?  ?GOALS: ?SHORT TERM GOALS: Target date: 06/19/2021 ?  ?Patient will be independent with initial HEP and self-management strategies to improve functional outcomes ?Baseline:  ?Goal status: ongoing ?  ?  ?LONG TERM GOALS: Target date: 07/03/2021 ?  ?Patient will be independent with advanced HEP and self-management strategies to improve functional outcomes ?Baseline:  ?Goal status: ongoing ?  ?2.   Patient will improve FOTO score to predicted value to indicate improvement in functional outcomes ?Baseline:  ?Goal status: ongoing ?  ?3.  Patient will report reduction of back pain to <3/10 for improved quality of life and ability to perform ADLs  ?Baseline:  ?Goal status: ongoing ?  ?4. Patient will have equal to or > 4+/5 MMT throughout BLE to improve ability to perform functional mobility, stair ambulation and ADLs.  ?Baseline:  ?Goal status: ongoing  ?  ?  ?PLAN: ?PT FREQUENCY: 2x/week ?  ?PT DURATION:  4 weeks ?  ?PLANNED INTERVENTIONS: Therapeutic exercises, Therapeutic activity, Neuromuscular re-education, Balance training, Gait training, Patient/Family education, Joint manipulation, Joint mobilization, Stair training, Aquatic Therapy, Dry Needling, Electrical stimulation, Spinal manipulation, Spinal mobilization, Cryotherapy, Moist heat, scar mobilization, Taping, Traction, Ultrasound, Biofeedback, Ionotophoresis '4mg'$ /ml Dexamethasone, and Manual therapy. ?. ?  ?PLAN FOR NEXT SESSION: Progress lumbar and thoracic mobility. Core and scapular strengthening. Manual STM as needed. ? ? ?4:46 PM, 06/21/21 ?Josue Hector PT DPT  ?Physical Therapist with [place]  ?[facility]  ?[phone] ? ?  ? ?

## 2021-06-27 ENCOUNTER — Ambulatory Visit (HOSPITAL_COMMUNITY): Payer: 59 | Admitting: Physical Therapy

## 2021-06-27 DIAGNOSIS — M546 Pain in thoracic spine: Secondary | ICD-10-CM

## 2021-06-27 DIAGNOSIS — M5459 Other low back pain: Secondary | ICD-10-CM | POA: Diagnosis not present

## 2021-06-27 NOTE — Therapy (Signed)
?OUTPATIENT PHYSICAL THERAPY TREATMENT NOTE ? ? ?Patient Name: Kenlea Woodell ?MRN: [id number] ?DOB:24-Jul-1975, 46 y.o., female ?Today's Date: 06/27/2021 ? ?PCP: Lindell Spar, MD ?REFERRING PROVIDER: Lindell Spar, MD ? ?END OF SESSION:  ? PT End of Session - 06/27/21 1627   ? ? Visit Number 5   ? Number of Visits 8   ? Date for PT Re-Evaluation 07/03/21   ? Authorization Type [employer] (30 VL)   ? PT Start Time 1616   ? PT Stop Time 1700   ? PT Time Calculation (min) 44 min   ? Activity Tolerance Patient tolerated treatment well   ? Behavior During Therapy [facility] for tasks assessed/performed   ? ?  ?  ? ?  ? ? ? ? ?Past Medical History:  ?Diagnosis Date  ? Breast nodule 12/08/2014  ? Breast pain, left 12/08/2014  ? Elevated BP 12/08/2014  ? Trichimoniasis 12/16/2014  ? Trichimoniasis 12/16/2014  ? ?Past Surgical History:  ?Procedure Laterality Date  ? TUBAL LIGATION    ? ?Patient Active Problem List  ? Diagnosis Date Noted  ? Gastroesophageal reflux disease 05/11/2021  ? Chest pain 10/12/2020  ? Annual physical exam 06/13/2020  ? Migraine 06/13/2020  ? 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 Neck pain 03/14/2020  ? Prediabetes 12/07/2019  ? Obesity (BMI 30.0-34.9) 12/07/2019  ? 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 Breast nodule 12/08/2014  ? Breast pain, left 12/08/2014  ? Hypertension 12/08/2014  ? ? ?REFERRING DIAG: low back pain ? ?THERAPY DIAG:  ?Other low back pain ? ?Pain in thoracic spine ? ?PERTINENT HISTORY: none ? ?PRECAUTIONS: none ? ?SUBJECTIVE: Patient says her pain was high over the weekend due to increased work around home to around 5/10 but today she is not having any pain. ?Pt accompanied by interpreter Lorn Junes  ? ?PAIN:  ?Are you having pain? No, not right now ? ? ? ? ?OBJECTIVE:  ?  ? Below measures assessed at initial evaluation on 06/05/21: ?DIAGNOSTIC FINDINGS:  ?IMPRESSION: ?1. Minimal degenerative changes. ?2. Possible bilateral pars defect at L5 ?  ?   ?PATIENT SURVEYS:  ?FOTO 53% function   ?  ?  ?COGNITION: ?          Overall cognitive status: Within functional limits for tasks assessed               ?           ?  ?POSTURE:  ?Rounded shoulders ?  ?PALPATION: ?Min/ mod TTP about lumbar and thoracic paraspinals  ?  ?LUMBAR ROM:  ?  ?Active  A/PROM  ?06/05/2021  ?Flexion WFL  ?Extension 25% limited  ?Right lateral flexion WFL  ?Left lateral flexion WFL  ?WFL  ?Right rotation WFL  ?Left rotation WFL  ? (Blank rows = not tested) ?  ?  ?LE MMT: ?  ?MMT Right ?06/05/2021 Left ?06/05/2021  ?Hip flexion 5 5  ?Hip extension 4- 4-  ?Hip abduction 4+ 4+  ?Hip adduction      ?Hip internal rotation      ?Hip external rotation      ?Knee flexion      ?Knee extension 5 5  ?Ankle dorsiflexion 5 5  ?Ankle plantarflexion      ?Ankle inversion      ?Ankle eversion      ? (Blank rows = not tested) ?  ?  ?  ?TODAY'S TREATMENT  ? ?06/27/21 ?Supine: ?SKTC 10X10" holds  each ?LTR 10X5" ?Bridge 20X ?Hamstring stretch with towel 2X30" ?SLR 10X2 each ?Prone: ?[place] 10X5" ?Hip Extension 2X10 each ?Standing ?RTB Rows 2X10 ?RTB extensions with SLS 10X each LE (20X total) ?RTB  Paloff press 10X2 each direction ? ?06/21/21 ?SKTC 10 x 5" ?LTR 10 x 5"  ?Open book stretch 10 x 5" each  ?Bridge x20  ?SLR 2 x 10 each  ?Sidelying leg raise 2 x10  ?Band rows RTB 2 x 10  ?Band shoulder extension RTB 2 x 10 ? ? ?06/14/21 ?Seated: Wback 10X ? UE flexion 10X ? Sit to stand 10X no UE standard height ?Supine: LTR 10X ? Bridge 2X10 ? SLR 10X each ? [place] 3X30" each ? Hamstring stretch with towel 3X30" ?Sidelying: Hip abduction ?Prone: Hip extension 10X each ? ? ?Open book stretch x 10; right sidelying  ?LTR x 10 ?Scap retraction x 10 ?Hip Add with ball x 10 ?Hip ABD with belt isometric x 10 ? ?  ?PATIENT EDUCATION:  ?Education details: 06/05/21 on evaluation findings, POC and HEP ; 4/12: continuation of exercises, core stability ?Person educated: Patient and Child(ren) ?Education method: Explanation and  Demonstration ?Education comprehension: verbalized understanding and returned demonstration ?  ?  ?HOME EXERCISE PROGRAM: ? 06/21/21 ?Access Code: [email or web address] ?URL: [email or web address] ?Exercises ?- Sidelying Hip Abduction   ?- Standing Shoulder Row with Anchored Resistance   ?- Shoulder extension with resistance  ? ?06/14/21:  ?Given at evaluation: Bridge, hip add, hip abd ? ?Evaluation 06/05/21:  Access Code: [email or web address] ?URL: [email or web address] ?Date: 06/05/2021 ?Prepared by: Josue Hector ?  ?Exercises ?- Sidelying Thoracic and Shoulder Rotation  - 3 x daily - 7 x weekly - 1 sets - 10 reps - 5-10 second hold ?- Supine Lower Trunk Rotation  - 3 x daily - 7 x weekly - 1 sets - 10 reps - 5-10 second hold ?- Supine Scapular Retraction  - 3 x daily - 7 x weekly - 1 sets - 10 reps - 5-10 second hold ?  ?ASSESSMENT: ?  ?CLINICAL IMPRESSION: ?Continued with hip and postural strengthening.  Began prone glute therex with challenge but no pain reported. Progressed to standing theraband exericses and core stab with completion of paloff activitiy and SLS with extensions.  Pt able to complete without LOB and in good control.  Tightness noted in hamstrings with stretch using towel.  Patient will continue to benefit from skilled therapy services to reduce remaining deficits and improve functional ability.  ? ?   ?OBJECTIVE IMPAIRMENTS decreased activity tolerance, decreased mobility, decreased ROM, decreased strength, increased fascial restrictions, impaired flexibility, improper body mechanics, postural dysfunction, and pain.  ?  ?ACTIVITY LIMITATIONS cleaning, community activity, driving, meal prep, occupation, laundry, yard work, shopping, and yard work.  ?  ?PERSONAL FACTORS  none  are also affecting patient's functional outcome.  ?  ?  ?  ?GOALS: ?SHORT TERM GOALS: Target date: 06/19/2021 ?  ?Patient will be independent with initial HEP and self-management strategies to improve functional  outcomes ?Baseline:  ?Goal status: ongoing ?  ?  ?LONG TERM GOALS: Target date: 07/03/2021 ?  ?Patient will be independent with advanced HEP and self-management strategies to improve functional outcomes ?Baseline:  ?Goal status: ongoing ?  ?2.  Patient will improve FOTO score to predicted value to indicate improvement in functional outcomes ?Baseline:  ?Goal status: ongoing ?  ?3.  Patient will report reduction of back pain to <3/10 for improved quality of life and ability to perform ADLs  ?Baseline:  ?Goal  status: ongoing ?  ?4. Patient will have equal to or > 4+/5 MMT throughout BLE to improve ability to perform functional mobility, stair ambulation and ADLs.  ?Baseline:  ?Goal status: ongoing  ?  ?  ?PLAN: ?PT FREQUENCY: 2x/week ?  ?PT DURATION: 4 weeks ?  ?PLANNED INTERVENTIONS: Therapeutic exercises, Therapeutic activity, Neuromuscular re-education, Balance training, Gait training, Patient/Family education, Joint manipulation, Joint mobilization, Stair training, Aquatic Therapy, Dry Needling, Electrical stimulation, Spinal manipulation, Spinal mobilization, Cryotherapy, Moist heat, scar mobilization, Taping, Traction, Ultrasound, Biofeedback, Ionotophoresis '4mg'$ /ml Dexamethasone, and Manual therapy. ?. ?  ?PLAN FOR NEXT SESSION: Progress lumbar and thoracic mobility. Core and scapular strengthening. Manual STM as needed. ? ? ?5:07 PM, 06/27/21 ?Oona Trammel Sula Soda, PTA/CLT, WTA ?[phone] ? ?  ? ?

## 2021-06-29 ENCOUNTER — Encounter (HOSPITAL_COMMUNITY): Payer: Self-pay

## 2021-06-29 ENCOUNTER — Ambulatory Visit (HOSPITAL_COMMUNITY): Payer: 59

## 2021-06-29 DIAGNOSIS — M5459 Other low back pain: Secondary | ICD-10-CM | POA: Diagnosis not present

## 2021-06-29 DIAGNOSIS — M546 Pain in thoracic spine: Secondary | ICD-10-CM

## 2021-06-29 NOTE — Therapy (Signed)
?OUTPATIENT PHYSICAL THERAPY TREATMENT NOTE ? ? ?Patient Name: Julie Butler ?MRN: [id number] ?DOB:1975/08/18, 46 y.o., female ?Today's Date: 06/29/2021 ? ?PCP: Lindell Spar, MD ?REFERRING PROVIDER: Lindell Spar, MD ? ?END OF SESSION:  ? PT End of Session - 06/29/21 1713   ? ? Visit Number 6   ? Number of Visits 8   ? Date for PT Re-Evaluation 07/03/21   ? Authorization Type [employer] (30 VL)   ? PT Start Time 9417   ? PT Stop Time 4081   ? PT Time Calculation (min) 41 min   ? Activity Tolerance Patient tolerated treatment well   ? Behavior During Therapy [facility] for tasks assessed/performed   ? ?  ?  ? ?  ? ? ? ? ?Past Medical History:  ?Diagnosis Date  ? Breast nodule 12/08/2014  ? Breast pain, left 12/08/2014  ? Elevated BP 12/08/2014  ? Trichimoniasis 12/16/2014  ? Trichimoniasis 12/16/2014  ? ?Past Surgical History:  ?Procedure Laterality Date  ? TUBAL LIGATION    ? ?Patient Active Problem List  ? Diagnosis Date Noted  ? Gastroesophageal reflux disease 05/11/2021  ? Chest pain 10/12/2020  ? Annual physical exam 06/13/2020  ? Migraine 06/13/2020  ? 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 Neck pain 03/14/2020  ? Prediabetes 12/07/2019  ? Obesity (BMI 30.0-34.9) 12/07/2019  ? 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 Breast nodule 12/08/2014  ? Breast pain, left 12/08/2014  ? Hypertension 12/08/2014  ? ? ?REFERRING DIAG: low back pain ? ?THERAPY DIAG:  ?Other low back pain ? ?Pain in thoracic spine ? ?PERTINENT HISTORY: none ? ?PRECAUTIONS: none ? ?SUBJECTIVE: Pt stated she is feeling good today, no reports of pain today. ?interpreter  Eyvonne Mechanic  ? ?PAIN:  ?Are you having pain? No, not right now ? ? ? ? ?OBJECTIVE:  ?  ? Below measures assessed at initial evaluation on 06/05/21: ?DIAGNOSTIC FINDINGS:  ?IMPRESSION: ?1. Minimal degenerative changes. ?2. Possible bilateral pars defect at L5 ?  ?  ?PATIENT SURVEYS:  ?FOTO 53% function   ?  ?  ?COGNITION: ?          Overall cognitive  status: Within functional limits for tasks assessed               ?           ?  ?POSTURE:  ?Rounded shoulders ?  ?PALPATION: ?Min/ mod TTP about lumbar and thoracic paraspinals  ?  ?LUMBAR ROM:  ?  ?Active  A/PROM  ?06/05/2021  ?Flexion WFL  ?Extension 25% limited  ?Right lateral flexion WFL  ?Left lateral flexion WFL  ?WFL  ?Right rotation WFL  ?Left rotation WFL  ? (Blank rows = not tested) ?  ?  ?LE MMT: ?  ?MMT Right ?06/05/2021 Left ?06/05/2021  ?Hip flexion 5 5  ?Hip extension 4- 4-  ?Hip abduction 4+ 4+  ?Hip adduction      ?Hip internal rotation      ?Hip external rotation      ?Knee flexion      ?Knee extension 5 5  ?Ankle dorsiflexion 5 5  ?Ankle plantarflexion      ?Ankle inversion      ?Ankle eversion      ? (Blank rows = not tested) ?  ?  ?  ?TODAY'S TREATMENT  ?06/29/21 ?Standing ?Squat 10x ?SLS ?RTB scapular retraction ?RTB Rows 2X10 ?RTB extensions with SLS 10X each LE (20X total) ?RTB  Paloff press 10X2 each direction ?Prone: Hip Extension 2X10 each 3" holds ?Supine: marching with ab sets 20x ? Bridge ? Hamstring stretch with rope 2x 30" ? ?06/27/21 ?Supine: ?SKTC 10X10" holds each ?LTR 10X5" ?Bridge 20X ?Hamstring stretch with towel 2X30" ?SLR 10X2 each ?Prone: ?Heelsqueeze 10X5" ?Hip Extension 2X10 each ?Standing ?RTB Rows 2X10 ?RTB extensions with SLS 10X each LE (20X total) ?RTB  Paloff press 10X2 each direction ? ?06/21/21 ?SKTC 10 x 5" ?LTR 10 x 5"  ?Open book stretch 10 x 5" each  ?Bridge x20  ?SLR 2 x 10 each  ?Sidelying leg raise 2 x10  ?Band rows RTB 2 x 10  ?Band shoulder extension RTB 2 x 10 ? ? ?06/14/21 ?Seated: Wback 10X ? UE flexion 10X ? Sit to stand 10X no UE standard height ?Supine: LTR 10X ? Bridge 2X10 ? SLR 10X each ? Fertile 3X30" each ? Hamstring stretch with towel 3X30" ?Sidelying: Hip abduction ?Prone: Hip extension 10X each ? ? ?Open book stretch x 10; right sidelying  ?LTR x 10 ?Scap retraction x 10 ?Hip Add with ball x 10 ?Hip ABD with belt isometric x 10 ? ?  ?PATIENT EDUCATION:   ?Education details: 06/05/21 on evaluation findings, POC and HEP ; 4/12: continuation of exercises, core stability ?Person educated: Patient and Child(ren) ?Education method: Explanation and Demonstration ?Education comprehension: verbalized understanding and returned demonstration ?  ?  ?HOME EXERCISE PROGRAM: ? 06/21/21 ?Access Code: [email or web address] ?URL: [email or web address] ?Exercises ?- Sidelying Hip Abduction   ?- Standing Shoulder Row with Anchored Resistance   ?- Shoulder extension with resistance  ? ?06/14/21:  ?Given at evaluation: Bridge, hip add, hip abd ? ?Evaluation 06/05/21:  Access Code: [email or web address] ?URL: [email or web address] ?Date: 06/05/2021 ?Prepared by: Josue Hector ?  ?Exercises ?- Sidelying Thoracic and Shoulder Rotation  - 3 x daily - 7 x weekly - 1 sets - 10 reps - 5-10 second hold ?- Supine Lower Trunk Rotation  - 3 x daily - 7 x weekly - 1 sets - 10 reps - 5-10 second hold ?- Supine Scapular Retraction  - 3 x daily - 7 x weekly - 1 sets - 10 reps - 5-10 second hold ?  ?ASSESSMENT: ?  ?CLINICAL IMPRESSION: ?Continued session focus with postural and hip strengthening.  Pt able to complete all exercises with good form following cueing for mechanics.  Most challenged with abdominal sets paired with movement and breathing.  Added squats for gluteal functional strengthening with cueing for mechanics.  No reports of pain through session, did c/o calf cramping.  ? ?   ?OBJECTIVE IMPAIRMENTS decreased activity tolerance, decreased mobility, decreased ROM, decreased strength, increased fascial restrictions, impaired flexibility, improper body mechanics, postural dysfunction, and pain.  ?  ?ACTIVITY LIMITATIONS cleaning, community activity, driving, meal prep, occupation, laundry, yard work, shopping, and yard work.  ?  ?PERSONAL FACTORS  none  are also affecting patient's functional outcome.  ?  ?  ?  ?GOALS: ?SHORT TERM GOALS: Target date: 06/19/2021 ?  ?Patient will be independent  with initial HEP and self-management strategies to improve functional outcomes ?Baseline:  ?Goal status: ongoing ?  ?  ?LONG TERM GOALS: Target date: 07/03/2021 ?  ?Patient will be independent with advanced HEP and self-management strategies to improve functional outcomes ?Baseline:  ?Goal status: ongoing ?  ?2.  Patient will improve FOTO score to predicted value to indicate improvement in functional outcomes ?Baseline:  ?Goal status: ongoing ?  ?3.  Patient will report reduction of back pain  to <3/10 for improved quality of life and ability to perform ADLs  ?Baseline:  ?Goal status: ongoing ?  ?4. Patient will have equal to or > 4+/5 MMT throughout BLE to improve ability to perform functional mobility, stair ambulation and ADLs.  ?Baseline:  ?Goal status: ongoing  ?  ?  ?PLAN: ?PT FREQUENCY: 2x/week ?  ?PT DURATION: 4 weeks ?  ?PLANNED INTERVENTIONS: Therapeutic exercises, Therapeutic activity, Neuromuscular re-education, Balance training, Gait training, Patient/Family education, Joint manipulation, Joint mobilization, Stair training, Aquatic Therapy, Dry Needling, Electrical stimulation, Spinal manipulation, Spinal mobilization, Cryotherapy, Moist heat, scar mobilization, Taping, Traction, Ultrasound, Biofeedback, Ionotophoresis '4mg'$ /ml Dexamethasone, and Manual therapy. ?. ?  ?PLAN FOR NEXT SESSION: Progress lumbar and thoracic mobility. Core and scapular strengthening. Manual STM as needed.  Add vector stance for hip stability. ? ?Ihor Austin, LPTA/CLT; CBIS ?[phone] ? ?5:57 PM, 06/29/21 ? ? ?  ? ?

## 2021-07-04 ENCOUNTER — Encounter (HOSPITAL_COMMUNITY): Payer: Self-pay | Admitting: Physical Therapy

## 2021-07-04 ENCOUNTER — Ambulatory Visit (HOSPITAL_COMMUNITY): Payer: 59 | Attending: Internal Medicine | Admitting: Physical Therapy

## 2021-07-04 DIAGNOSIS — M546 Pain in thoracic spine: Secondary | ICD-10-CM | POA: Diagnosis present

## 2021-07-04 DIAGNOSIS — M5459 Other low back pain: Secondary | ICD-10-CM | POA: Diagnosis present

## 2021-07-04 NOTE — Therapy (Signed)
?OUTPATIENT PHYSICAL THERAPY TREATMENT NOTE ? ? ?Patient Name: Julie Butler ?MRN: [id number] ?DOB:[birth year], 46 y.o., female ?Today's Date: 07/04/2021 ?Progress Note ?Reporting Period 06/05/21 to 07/04/21 ? ?See note below for Objective Data and Assessment of Progress/Goals.  ? ? ? ?PCP: Lindell Spar, MD ?REFERRING PROVIDER: Lindell Spar, MD ? ?END OF SESSION:  ? PT End of Session - 07/04/21 1651   ? ? Visit Number 7   ? Number of Visits 15   ? Date for PT Re-Evaluation 08/01/21   ? Authorization Type [employer] (30 VL)   ? PT Start Time 0630   ? PT Stop Time 1728   ? PT Time Calculation (min) 38 min   ? Activity Tolerance Patient tolerated treatment well   ? Behavior During Therapy [facility] for tasks assessed/performed   ? ?  ?  ? ?  ? ? ? ? ?Past Medical History:  ?Diagnosis Date  ? Breast nodule 12/08/2014  ? Breast pain, left 12/08/2014  ? Elevated BP 12/08/2014  ? Trichimoniasis 12/16/2014  ? Trichimoniasis 12/16/2014  ? ?Past Surgical History:  ?Procedure Laterality Date  ? TUBAL LIGATION    ? ?Patient Active Problem List  ? Diagnosis Date Noted  ? Gastroesophageal reflux disease 05/11/2021  ? Chest pain 10/12/2020  ? Annual physical exam 06/13/2020  ? Migraine 06/13/2020  ? 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 Neck pain 03/14/2020  ? Prediabetes 12/07/2019  ? Obesity (BMI 30.0-34.9) 12/07/2019  ? 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 Breast nodule 12/08/2014  ? Breast pain, left 12/08/2014  ? Hypertension 12/08/2014  ? ? ?REFERRING DIAG: low back pain ? ?THERAPY DIAG:  ?Other low back pain ? ?Pain in thoracic spine ? ?PERTINENT HISTORY: none ? ?PRECAUTIONS: none ? ?SUBJECTIVE: Patient doing well. She feels exercises have been helpful. Patient reports 75% improvement since starting therapy.  ?interpreter  Carina  ? ?PAIN:  ?Are you having pain? No, not right now ? ? ? ? ?OBJECTIVE:  ?  ? Below measures assessed at initial evaluation on 06/05/21: ?DIAGNOSTIC FINDINGS:   ?IMPRESSION: ?1. Minimal degenerative changes. ?2. Possible bilateral pars defect at L5 ?  ?  ?PATIENT SURVEYS:  ?FOTO 53% function   ?  ?  ?COGNITION: ?          Overall cognitive status: Within functional limits for tasks assessed               ?           ?  ?POSTURE:  ?Rounded shoulders ?  ?PALPATION: ?Min/ mod TTP about lumbar and thoracic paraspinals  ?  ?LUMBAR ROM:  ?  ?Active  A/PROM  ?06/05/2021  ?Flexion WFL  ?Extension 25% limited  ?Right lateral flexion WFL  ?Left lateral flexion WFL  ?WFL  ?Right rotation WFL  ?Left rotation WFL  ? (Blank rows = not tested) ?  ?  ?LE MMT: ?  ?MMT Right ?06/05/2021 Right ?07/04/2021 Left ?06/05/2021 Left ?07/04/2021  ?Hip flexion _0 ?Hip extension 4- 4+ (pain) 4- 4+ (pain)  ?Hip abduction 4+ 5 4+ 5  ?Hip adduction        ?Hip internal rotation        ?Hip external rotation        ?Knee flexion        ?Knee extension _1 ?Ankle dorsiflexion 5  5   ?Ankle plantarflexion        ?  Ankle inversion        ?Ankle eversion        ? (Blank rows = not tested) ?  ?  ?  ?TODAY'S TREATMENT  ?07/04/21 ?Reassessment ?Ab march x10 ?Deadbug x10 ?Wall push up x10 ? ?06/29/21 ?Standing ?Squat 10x ?SLS ?RTB scapular retraction ?RTB Rows 2X10 ?RTB extensions with SLS 10X each LE (20X total) ?RTB  Paloff press 10X2 each direction ?Prone: Hip Extension 2X10 each 3" holds ?Supine: marching with ab sets 20x ? Bridge ? Hamstring stretch with rope 2x 30" ? ?06/27/21 ?Supine: ?SKTC 10X10" holds each ?LTR 10X5" ?Bridge 20X ?Hamstring stretch with towel 2X30" ?SLR 10X2 each ?Prone: ?Heelsqueeze 10X5" ?Hip Extension 2X10 each ?Standing ?RTB Rows 2X10 ?RTB extensions with SLS 10X each LE (20X total) ?RTB  Paloff press 10X2 each direction ? ? ?  ?PATIENT EDUCATION:  ?Education details: 06/05/21 on evaluation findings, POC and HEP ; 4/12: continuation of exercises, core stability 5/2 progress to goals, POC and HEP ?Person educated: Patient  ?Education method: Explanation and Demonstration ?Education  comprehension: verbalized understanding and returned demonstration ?  ?  ?HOME EXERCISE PROGRAM: ? 5/2.23 ? Ab May 16, 2022 ?Dead bug ?Wall push up  ?06/27/21 ?Access Code: [email or web address] ?URL: [email or web address] ?Exercises ?- Sidelying Hip Abduction   ?- Standing Shoulder Row with Anchored Resistance   ?- Shoulder extension with resistance  ? ?06/14/21:  ?Given at evaluation: Bridge, hip add, hip abd ? ?Evaluation 06/05/21:  Access Code: [email or web address] ?URL: [email or web address] ?Date: 06/05/2021 ?Prepared by: Josue Hector ?  ?Exercises ?- Sidelying Thoracic and Shoulder Rotation  - 3 x daily - 7 x weekly - 1 sets - 10 reps - 5-10 second hold ?- Supine Lower Trunk Rotation  - 3 x daily - 7 x weekly - 1 sets - 10 reps - 5-10 second hold ?- Supine Scapular Retraction  - 3 x daily - 7 x weekly - 1 sets - 10 reps - 5-10 second hold ?  ?ASSESSMENT: ?  ?CLINICAL IMPRESSION: ?Patient making slow/ steady progress. Improved strength measures but continues to be limited by pain. Patient does note significant subjective improvement, but would like to continue with therapy for progressive strengthening to improve low and mid back pain with activity.  ? ?   ?OBJECTIVE IMPAIRMENTS decreased activity tolerance, decreased mobility, decreased ROM, decreased strength, increased fascial restrictions, impaired flexibility, improper body mechanics, postural dysfunction, and pain.  ?  ?ACTIVITY LIMITATIONS cleaning, community activity, driving, meal prep, occupation, laundry, yard work, shopping, and yard work.  ?  ?PERSONAL FACTORS  none  are also affecting patient's functional outcome.  ?  ?  ?  ?GOALS: ?SHORT TERM GOALS: Target date: 06/19/2021 ?  ?Patient will be independent with initial HEP and self-management strategies to improve functional outcomes ?Baseline:  ?Goal status: ongoing ?  ?  ?LONG TERM GOALS: Target date: 07/03/2021 ?  ?Patient will be independent with advanced HEP and self-management strategies to improve  functional outcomes ?Baseline:  ?Goal status: ongoing ?  ?2.  Patient will improve FOTO score to predicted value to indicate improvement in functional outcomes ?Baseline: 1% increase ?Goal status: ongoing ?  ?3.  Patient will report reduction of back pain to <3/10 for improved quality of life and ability to perform ADLs  ?Baseline: 3/10 ?Goal status: ongoing ?  ?4. Patient will have equal to or > 4+/5 MMT throughout BLE to improve ability to perform functional mobility, stair ambulation and ADLs.  ?Baseline:  ?Goal status: MET ?  ?  ?PLAN: ?PT  FREQUENCY: 2x/week ?  ?PT DURATION: 4 weeks ?  ?PLANNED INTERVENTIONS: Therapeutic exercises, Therapeutic activity, Neuromuscular re-education, Balance training, Gait training, Patient/Family education, Joint manipulation, Joint mobilization, Stair training, Aquatic Therapy, Dry Needling, Electrical stimulation, Spinal manipulation, Spinal mobilization, Cryotherapy, Moist heat, scar mobilization, Taping, Traction, Ultrasound, Biofeedback, Ionotophoresis 36m/ml Dexamethasone, and Manual therapy. ? ?PLAN FOR NEXT SESSION: Progress lumbar and thoracic mobility. Core and scapular strengthening. Manual STM as needed.  Add vector stance for hip stability. ? ?5:29 PM, 07/04/21 ?CJosue HectorPT DPT  ?Physical Therapist with [place] [facility] ?[phone]? ? ?

## 2021-07-06 ENCOUNTER — Ambulatory Visit (HOSPITAL_COMMUNITY): Payer: 59

## 2021-07-06 DIAGNOSIS — M5459 Other low back pain: Secondary | ICD-10-CM | POA: Diagnosis not present

## 2021-07-06 DIAGNOSIS — M546 Pain in thoracic spine: Secondary | ICD-10-CM

## 2021-07-06 NOTE — Therapy (Signed)
?OUTPATIENT PHYSICAL THERAPY TREATMENT NOTE ? ? ?Patient Name: Julie Butler ?MRN: [id number] ?DOB:1975-11-09, 46 y.o., female ?Today's Date: 07/06/2021 ?Progress Note ?Reporting Period 06/05/21 to 07/04/21 ? ?See note below for Objective Data and Assessment of Progress/Goals.  ? ? ? ?PCP: Lindell Spar, MD ?REFERRING PROVIDER: Lindell Spar, MD ? ?END OF SESSION:  ? PT End of Session - 07/06/21 1701   ? ? Visit Number 8   ? Number of Visits 15   ? Date for PT Re-Evaluation 08/01/21   ? Authorization Type [employer] (30 VL)   ? PT Start Time 1651   ? PT Stop Time 1730   ? PT Time Calculation (min) 39 min   ? Activity Tolerance Patient tolerated treatment well   ? Behavior During Therapy [facility] for tasks assessed/performed   ? ?  ?  ? ?  ? ? ? ? ? ?Past Medical History:  ?Diagnosis Date  ? Breast nodule 12/08/2014  ? Breast pain, left 12/08/2014  ? Elevated BP 12/08/2014  ? Trichimoniasis 12/16/2014  ? Trichimoniasis 12/16/2014  ? ?Past Surgical History:  ?Procedure Laterality Date  ? TUBAL LIGATION    ? ?Patient Active Problem List  ? Diagnosis Date Noted  ? Gastroesophageal reflux disease 05/11/2021  ? Chest pain 10/12/2020  ? Annual physical exam 06/13/2020  ? Migraine 06/13/2020  ? 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 Neck pain 03/14/2020  ? Prediabetes 12/07/2019  ? Obesity (BMI 30.0-34.9) 12/07/2019  ? 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 Breast nodule 12/08/2014  ? Breast pain, left 12/08/2014  ? Hypertension 12/08/2014  ? ? ?REFERRING DIAG: low back pain ? ?THERAPY DIAG:  ?Other low back pain ? ?Pain in thoracic spine ? ?PERTINENT HISTORY: none ? ?PRECAUTIONS: none ? ?SUBJECTIVE: No pain today on patient arrival.   ?interpreter  Eddie ? ?PAIN:  ?Are you having pain? No, not right now ? ? ? ? ?OBJECTIVE:  ?  ? Below measures assessed at initial evaluation on 06/05/21: ?DIAGNOSTIC FINDINGS:  ?IMPRESSION: ?1. Minimal degenerative changes. ?2. Possible bilateral pars defect at  L5 ?  ?  ?PATIENT SURVEYS:  ?FOTO 53% function   ?  ?  ?COGNITION: ?          Overall cognitive status: Within functional limits for tasks assessed               ?           ?  ?POSTURE:  ?Rounded shoulders ?  ?PALPATION: ?Min/ mod TTP about lumbar and thoracic paraspinals  ?  ?LUMBAR ROM:  ?  ?Active  A/PROM  ?06/05/2021  ?Flexion WFL  ?Extension 25% limited  ?Right lateral flexion WFL  ?Left lateral flexion WFL  ?WFL  ?Right rotation WFL  ?Left rotation WFL  ? (Blank rows = not tested) ?  ?  ?LE MMT: ?  ?MMT Right ?06/05/2021 Right ?07/04/2021 Left ?06/05/2021 Left ?07/04/2021  ?Hip flexion _0 ?Hip extension 4- 4+ (pain) 4- 4+ (pain)  ?Hip abduction 4+ 5 4+ 5  ?Hip adduction        ?Hip internal rotation        ?Hip external rotation        ?Knee flexion        ?Knee extension _1 ?Ankle dorsiflexion 5  5   ?Ankle plantarflexion        ?Ankle inversion        ?  Ankle eversion        ? (Blank rows = not tested) ?  ?  ?  ?TODAY'S TREATMENT  ?Nustep seat 6 x 5' ?GTB Rows 2 x 10 ?GTB shoulder extension 2 x 10 ?GTB  Paloff press 10X2 each direction ?Hip vectors 2 x 8 each ?Heel raises 2 x 10 ?Ball squats 2 x 10 ?Wall push ups x 10 ? ?Supine: ?Dead bug x 10 ?Bridge 2 x 10 with UE elevated ? ? ? ? ? ?07/04/21 ?Reassessment ?Ab march x10 ?Deadbug x10 ?Wall push up x10 ? ?06/29/21 ?Standing ?Squat 10x ?SLS ?RTB scapular retraction ?RTB Rows 2X10 ?RTB extensions with SLS 10X each LE (20X total) ?RTB  Paloff press 10X2 each direction ?Prone: Hip Extension 2X10 each 3" holds ?Supine: marching with ab sets 20x ? Bridge ? Hamstring stretch with rope 2x 30" ? ?06/27/21 ?Supine: ?SKTC 10X10" holds each ?LTR 10X5" ?Bridge 20X ?Hamstring stretch with towel 2X30" ?SLR 10X2 each ?Prone: ?Heelsqueeze 10X5" ?Hip Extension 2X10 each ?Standing ?RTB Rows 2X10 ?RTB extensions with SLS 10X each LE (20X total) ?RTB  Paloff press 10X2 each direction ? ? ?  ?PATIENT EDUCATION:  ?Education details: 06/05/21 on evaluation findings, POC and HEP ; 4/12:  continuation of exercises, core stability 5/2 progress to goals, POC and HEP ?Person educated: Patient  ?Education method: Explanation and Demonstration ?Education comprehension: verbalized understanding and returned demonstration ?  ?  ?HOME EXERCISE PROGRAM: ? 5/2.23 ? Ab Jun 01, 2022 ?Dead bug ?Wall push up  ?07-07-21 ?Access Code: [email or web address] ?URL: [email or web address] ?Exercises ?- Sidelying Hip Abduction   ?- Standing Shoulder Row with Anchored Resistance   ?- Shoulder extension with resistance  ? ?06/14/21:  ?Given at evaluation: Bridge, hip add, hip abd ? ?Evaluation 06/05/21:  Access Code: [email or web address] ?URL: [email or web address] ?Date: 06/05/2021 ?Prepared by: Josue Hector ?  ?Exercises ?- Sidelying Thoracic and Shoulder Rotation  - 3 x daily - 7 x weekly - 1 sets - 10 reps - 5-10 second hold ?- Supine Lower Trunk Rotation  - 3 x daily - 7 x weekly - 1 sets - 10 reps - 5-10 second hold ?- Supine Scapular Retraction  - 3 x daily - 7 x weekly - 1 sets - 10 reps - 5-10 second hold ?  ?ASSESSMENT: ?  ?CLINICAL IMPRESSION: ?Today's session focused on progressing patient mobility and increasing hip and core stability.  Added hip vectors, ball squats, heel raises and  increased theraband exercise intensity to Green. Patient did well with all new exercise; reports no pain at the end of treatment.  Received signed plan of care to extend therapy from Dr. Posey Pronto.  Patient will benefit from continued skilled therapy interventions to address deficits and improve functional mobility.  ? ? ?   ?OBJECTIVE IMPAIRMENTS decreased activity tolerance, decreased mobility, decreased ROM, decreased strength, increased fascial restrictions, impaired flexibility, improper body mechanics, postural dysfunction, and pain.  ?  ?ACTIVITY LIMITATIONS cleaning, community activity, driving, meal prep, occupation, laundry, yard work, shopping, and yard work.  ?  ?PERSONAL FACTORS  none  are also affecting patient's functional  outcome.  ?  ?  ?  ?GOALS: ?SHORT TERM GOALS: Target date: 06/19/2021 ?  ?Patient will be independent with initial HEP and self-management strategies to improve functional outcomes ?Baseline:  ?Goal status: ongoing ?  ?  ?LONG TERM GOALS: Target date: 07/03/2021 ?  ?Patient will be independent with advanced HEP and self-management strategies to improve functional outcomes ?Baseline:  ?Goal status: ongoing ?  ?2.  Patient will improve FOTO score to predicted value to indicate improvement in functional outcomes ?Baseline: 1% increase ?Goal status: ongoing ?  ?3.  Patient will report reduction of back pain to <3/10 for improved quality of life and ability to perform ADLs  ?Baseline: 3/10 ?Goal status: ongoing ?  ?4. Patient will have equal to or > 4+/5 MMT throughout BLE to improve ability to perform functional mobility, stair ambulation and ADLs.  ?Baseline:  ?Goal status: MET ?  ?  ?PLAN: ?PT FREQUENCY: 2x/week ?  ?PT DURATION: 4 weeks ?  ?PLANNED INTERVENTIONS: Therapeutic exercises, Therapeutic activity, Neuromuscular re-education, Balance training, Gait training, Patient/Family education, Joint manipulation, Joint mobilization, Stair training, Aquatic Therapy, Dry Needling, Electrical stimulation, Spinal manipulation, Spinal mobilization, Cryotherapy, Moist heat, scar mobilization, Taping, Traction, Ultrasound, Biofeedback, Ionotophoresis 34m/ml Dexamethasone, and Manual therapy. ? ?PLAN FOR NEXT SESSION: Progress lumbar and thoracic mobility. Core and scapular strengthening. Manual STM as needed.   ?5:34 PM, 07/06/21 ?Cabrini Ruggieri Small Jameir Ake MPT ?[place] [facility] ?[place] #[phone]?[phone] ? ? ? ?

## 2021-07-11 NOTE — Telephone Encounter (Signed)
Pt stated she has some things to do in and would not be able to make it. Appt canceled per Pt request. ?

## 2021-07-13 ENCOUNTER — Encounter (HOSPITAL_COMMUNITY): Payer: 59 | Admitting: Physical Therapy

## 2021-07-14 ENCOUNTER — Encounter (HOSPITAL_COMMUNITY): Payer: 59

## 2021-07-26 ENCOUNTER — Ambulatory Visit (HOSPITAL_COMMUNITY): Payer: 59 | Attending: Internal Medicine

## 2021-07-26 ENCOUNTER — Encounter (HOSPITAL_COMMUNITY): Payer: Self-pay

## 2021-07-26 DIAGNOSIS — M546 Pain in thoracic spine: Secondary | ICD-10-CM | POA: Insufficient documentation

## 2021-07-26 DIAGNOSIS — M5459 Other low back pain: Secondary | ICD-10-CM | POA: Insufficient documentation

## 2021-07-26 NOTE — Therapy (Addendum)
OUTPATIENT PHYSICAL THERAPY TREATMENT NOTE   Patient Name: Julie Butler MRN: [id number] DOB:[birth year], 46 y.o., female Today's Date: 07/26/2021  PHYSICAL THERAPY DISCHARGE SUMMARY  Visits from Start of Care: 9  Current functional level related to goals / functional outcomes: See below    Remaining deficits: See below    Education / Equipment: See assessment   Patient agrees to discharge. Patient goals were met. Patient is being discharged due to meeting the stated rehab goals.   PCP: Lindell Spar, MD REFERRING PROVIDER: Lindell Spar, MD  END OF SESSION:   PT End of Session - 07/26/21 1548     Visit Number 9    Number of Visits 15    Date for PT Re-Evaluation 08/01/21    Authorization Type [employer] (30 VL)    PT Start Time 1545   late sign in   PT Stop Time 1615    PT Time Calculation (min) 30 min    Activity Tolerance Patient tolerated treatment well    Behavior During Therapy [facility] for tasks assessed/performed                 Past Medical History:  Diagnosis Date   Breast nodule 12/08/2014   Breast pain, left 12/08/2014   Elevated BP 12/08/2014   Trichimoniasis 12/16/2014   Trichimoniasis 12/16/2014   Past Surgical History:  Procedure Laterality Date   TUBAL LIGATION     Patient Active Problem List   Diagnosis Date Noted   Gastroesophageal reflux disease 05/11/2021   Chest pain 10/12/2020   Annual physical exam 06/13/2020   Migraine 06/13/2020   Chronic midline low back pain 03/14/2020   Neck pain 03/14/2020   Prediabetes 12/07/2019   Obesity (BMI 30.0-34.9) 12/07/2019   Mixed stress and urge urinary incontinence 02/03/2019   Breast nodule 12/08/2014   Breast pain, left 12/08/2014   Hypertension 12/08/2014    REFERRING DIAG: low back pain  THERAPY DIAG:  Other low back pain  Pain in thoracic spine  PERTINENT HISTORY: none  PRECAUTIONS: none  SUBJECTIVE: No pain today, reports a little fatigue as she  planted flowers in her garden the other day.     interpreter  [name]  PAIN:  Are you having pain? No, not right now     OBJECTIVE:     Below measures assessed at initial evaluation on 06/05/21: DIAGNOSTIC FINDINGS:  IMPRESSION: 1. Minimal degenerative changes. 2. Possible bilateral pars defect at L5     PATIENT SURVEYS:  FOTO 53% function       COGNITION:           Overall cognitive status: Within functional limits for tasks assessed                            POSTURE:  Rounded shoulders   PALPATION: Min/ mod TTP about lumbar and thoracic paraspinals    LUMBAR ROM:    Active  A/PROM  06/05/2021  Flexion WFL  Extension 25% limited  Right lateral flexion WFL  Left lateral flexion [facility]  WFL  Right rotation [facility]  Left rotation WFL   (Blank rows = not tested)     LE MMT:   MMT Right 06/05/2021 Right 07/04/2021 Left 06/05/2021 Left 07/04/2021 Right  07/26/21 Left 07/26/21  Hip flexion '5 5 5 5    ' Hip extension 4- 4+ (pain) 4- 4+ (pain) 5/5 pain free 5/5 pain free  Hip abduction 4+ 5  4+ 5 5/5 5/5  Hip adduction          Hip internal rotation          Hip external rotation          Knee flexion          Knee extension '5 5 5 5    ' Ankle dorsiflexion 5  5     Ankle plantarflexion          Ankle inversion          Ankle eversion           (Blank rows = not tested)       TODAY'S TREATMENT  07/26/21: GTB shoulder extension with alternating marching 10x each side GTB rows - HEP 3D hip excursion 10x Vector stance 5x 5" intermittent HHA FOTO, MMT, reviewed goals  07/06/21 Nustep seat 6 x 5' GTB Rows 2 x 10 GTB shoulder extension 2 x 10 GTB  Paloff press 10X2 each direction Hip vectors 2 x 8 each Heel raises 2 x 10 Ball squats 2 x 10 Wall push ups x 10  Supine: Dead bug x 10 Bridge 2 x 10 with UE elevated      07/04/21 Reassessment Ab May 12, 2022 x10 Deadbug x10 Wall push up x10  06/29/21 Standing Squat 10x SLS RTB scapular retraction RTB Rows  2X10 RTB extensions with SLS 10X each LE (20X total) RTB  Paloff press 10X2 each direction Prone: Hip Extension 2X10 each 3" holds Supine: marching with ab sets 20x  Bridge  Hamstring stretch with rope 2x 30"  06/27/21 Supine: SKTC 10X10" holds each LTR 10X5" Bridge 20X Hamstring stretch with towel 2X30" SLR 10X2 each Prone: Heelsqueeze 10X5" Hip Extension 2X10 each Standing RTB Rows 2X10 RTB extensions with SLS 10X each LE (20X total) RTB  Paloff press 10X2 each direction     PATIENT EDUCATION:  Education details: 06/05/21 on evaluation findings, POC and HEP ; 4/12: continuation of exercises, core stability 5/2 progress to goals, POC and HEP Person educated: Patient  Education method: Explanation and Demonstration Education comprehension: verbalized understanding and returned demonstration     HOME EXERCISE PROGRAM:  5/2.23  Ab 05-12-22 Dead bug Wall push up  June 25, 2021 Access Code: [email or web address] URL: [email or web address]  Exercises - Sidelying Hip Abduction   - Standing Shoulder Row with Anchored Resistance   - Shoulder extension with resistance   06/14/21:  Given at evaluation: Bridge, hip add, hip abd  Evaluation 06/05/21:  Access Code: [email or web address] URL: [email or web address] Date: 06/05/2021 Prepared by: Josue Hector   Exercises - Sidelying Thoracic and Shoulder Rotation  - 3 x daily - 7 x weekly - 1 sets - 10 reps - 5-10 second hold - Supine Lower Trunk Rotation  - 3 x daily - 7 x weekly - 1 sets - 10 reps - 5-10 second hold - Supine Scapular Retraction  - 3 x daily - 7 x weekly - 1 sets - 10 reps - 5-10 second hold   ASSESSMENT:   CLINICAL IMPRESSION: Session focus with core stability.  Pt able to complete all exercises with good form and minimal cueing required.  Pt reports she has been almost 3 weeks pain free and has been compliant with HEP and began walking program.  Reviewed goals with improved strength, ROM WNL, improved FOTO score  by 20% since initial eval.  Reviewed exercises to continue at home with additional GTB given and encouraged to continue walking program.  DC to HEP per goals met.  OBJECTIVE IMPAIRMENTS decreased activity tolerance, decreased mobility, decreased ROM, decreased strength, increased fascial restrictions, impaired flexibility, improper body mechanics, postural dysfunction, and pain.    ACTIVITY LIMITATIONS cleaning, community activity, driving, meal prep, occupation, laundry, yard work, shopping, and yard work.    PERSONAL FACTORS  none  are also affecting patient's functional outcome.        GOALS: SHORT TERM GOALS: Target date: 06/19/2021   Patient will be independent with initial HEP and self-management strategies to improve functional outcomes Baseline: 07/26/21:  Reports compliance with HEP Goal status:MET     LONG TERM GOALS: Target date: 07/03/2021   Patient will be independent with advanced HEP and self-management strategies to improve functional outcomes Baseline: 07/26/21:  Reports compliance with advanced HEP Goal status: MET   2.  Patient will improve FOTO score to predicted value to indicate improvement in functional outcomes Baseline: 72% was 51% Goal status: MET   3.  Patient will report reduction of back pain to <3/10 for improved quality of life and ability to perform ADLs  Baseline: 3/10; 07/26/21:  Reports 3 weeks pain free with ADLs Goal status: MET   4. Patient will have equal to or > 4+/5 MMT throughout BLE to improve ability to perform functional mobility, stair ambulation and ADLs.  Baseline:  07/26/21:  MMT 5/5 Goal status: MET     PLAN: PT FREQUENCY: 2x/week   PT DURATION: 4 weeks   PLANNED INTERVENTIONS: Therapeutic exercises, Therapeutic activity, Neuromuscular re-education, Balance training, Gait training, Patient/Family education, Joint manipulation, Joint mobilization, Stair training, Aquatic Therapy, Dry Needling, Electrical stimulation, Spinal  manipulation, Spinal mobilization, Cryotherapy, Moist heat, scar mobilization, Taping, Traction, Ultrasound, Biofeedback, Ionotophoresis 13m/ml Dexamethasone, and Manual therapy.  PLAN FOR NEXT SESSION: DC to HEP per goals met.   CIhor Austin LPTA/CLT; CBIS [phone] PM, 07/26/21

## 2021-08-01 ENCOUNTER — Encounter (HOSPITAL_COMMUNITY): Payer: 59

## 2021-08-03 ENCOUNTER — Encounter (HOSPITAL_COMMUNITY): Payer: 59 | Admitting: Physical Therapy

## 2021-12-18 ENCOUNTER — Ambulatory Visit (INDEPENDENT_AMBULATORY_CARE_PROVIDER_SITE_OTHER): Payer: 59 | Admitting: Internal Medicine

## 2021-12-18 ENCOUNTER — Encounter: Payer: Self-pay | Admitting: Internal Medicine

## 2021-12-18 VITALS — BP 122/84 | HR 84 | Resp 18 | Ht 64.0 in | Wt 193.6 lb

## 2021-12-18 DIAGNOSIS — Z1159 Encounter for screening for other viral diseases: Secondary | ICD-10-CM | POA: Diagnosis not present

## 2021-12-18 DIAGNOSIS — E782 Mixed hyperlipidemia: Secondary | ICD-10-CM | POA: Diagnosis not present

## 2021-12-18 DIAGNOSIS — Z0000 Encounter for general adult medical examination without abnormal findings: Secondary | ICD-10-CM

## 2021-12-18 DIAGNOSIS — R42 Dizziness and giddiness: Secondary | ICD-10-CM

## 2021-12-18 DIAGNOSIS — R7303 Prediabetes: Secondary | ICD-10-CM | POA: Diagnosis not present

## 2021-12-18 DIAGNOSIS — E559 Vitamin D deficiency, unspecified: Secondary | ICD-10-CM | POA: Diagnosis not present

## 2021-12-18 DIAGNOSIS — Z23 Encounter for immunization: Secondary | ICD-10-CM | POA: Diagnosis not present

## 2021-12-18 DIAGNOSIS — M549 Dorsalgia, unspecified: Secondary | ICD-10-CM | POA: Diagnosis not present

## 2021-12-18 DIAGNOSIS — K219 Gastro-esophageal reflux disease without esophagitis: Secondary | ICD-10-CM

## 2021-12-18 MED ORDER — OMEPRAZOLE 40 MG PO CPDR: 40.0000 mg | DELAYED_RELEASE_CAPSULE | Freq: Every day | ORAL | 3 refills | Status: DC

## 2021-12-18 MED ORDER — MECLIZINE HCL 25 MG PO TABS: 25.0000 mg | ORAL_TABLET | Freq: Two times a day (BID) | ORAL | 0 refills | Status: DC | PRN

## 2021-12-18 NOTE — Progress Notes (Signed)
Acute Office Visit  Subjective:    Patient ID: Julie Butler, female    DOB: 12-18-1975, 46 y.o.   MRN: [id number]  Chief Complaint  Patient presents with   Nausea    Pt having nausea dizziness for 1 month since 11-18-21 also pt has left upper quarant pain and lower back pain that started 10-16    HPI Patient is in today for complaint of epigastric and left upper quadrant abdominal pain for the last 2 days, which is burning in nature and is radiating towards the left side of the abdomen.  Of note, she was given omeprazole for history of GERD, but has been taking it recently.  She also reports nausea, which is worse after eating.  Denies any vomiting, constipation, diarrhea, melena or hematochezia.  She denies any fever or chills.  She also reports pain on the left side of the back, which is sharp, worse with movement.  Denies any local rash.  Denies any heavy lifting or recent injury.  She also complains of intermittent dizziness for the last 1 month, which is worse with positional changes.  She also has mild nausea.  Denies any ear pain or discharge.  Past Medical History:  Diagnosis Date   Breast nodule 12/08/2014   Breast pain, left 12/08/2014   Elevated BP 12/08/2014   Trichimoniasis 12/16/2014   Trichimoniasis 12/16/2014    Past Surgical History:  Procedure Laterality Date   TUBAL LIGATION      Family History  Problem Relation Age of Onset   Hypertension Father    Hyperlipidemia Father    Heart attack Father    Hyperlipidemia Mother    Hypertension Mother    Diabetes Mother    Stomach cancer Mother    Diabetes Sister    Hypertension Sister    Hyperlipidemia Sister     Social History   Socioeconomic History   Marital status: Married    Spouse name: Not on file   Number of children: 3   Years of education: Not on file   Highest education level: Not on file  Occupational History   Not on file  Tobacco Use   Smoking status: Never   Smokeless  tobacco: Never  Vaping Use   Vaping Use: Never used  Substance and Sexual Activity   Alcohol use: No   Drug use: No   Sexual activity: Yes    Birth control/protection: Surgical    Comment: tubal  Other Topics Concern   Not on file  Social History Narrative   Not on file   Social Determinants of Health   Financial Resource Strain: Medium Risk (12/01/2020)   Overall Financial Resource Strain (CARDIA)    Difficulty of Paying Living Expenses: Somewhat hard  Food Insecurity: No Food Insecurity (12/01/2020)   Hunger Vital Sign    Worried About Running Out of Food in the Last Year: Never true    Ran Out of Food in the Last Year: Never true  Transportation Needs: No Transportation Needs (12/01/2020)   PRAPARE - Hydrologist (Medical): No    Lack of Transportation (Non-Medical): No  Physical Activity: Inactive (12/01/2020)   Exercise Vital Sign    Days of Exercise per Week: 0 days    Minutes of Exercise per Session: 0 min  Stress: No Stress Concern Present (12/01/2020)   [place]    Feeling of Stress : Not at all  Social Connections: Moderately  Integrated (12/01/2020)   Social Connection and Isolation Panel [NHANES]    Frequency of Communication with Friends and Family: More than three times a week    Frequency of Social Gatherings with Friends and Family: Three times a week    Attends Religious Services: 1 to 4 times per year    Active Member of Clubs or Organizations: No    Attends Archivist Meetings: Never    Marital Status: Married  Human resources officer Violence: Not At Risk (12/01/2020)   Humiliation, Afraid, Rape, and Kick questionnaire    Fear of Current or Ex-Partner: No    Emotionally Abused: No    Physically Abused: No    Sexually Abused: No    Outpatient Medications Prior to Visit  Medication Sig Dispense Refill   albuterol (VENTOLIN HFA) 108 (90 Base) MCG/ACT inhaler  Inhale 2 puffs into the lungs every 6 (six) hours as needed for wheezing or shortness of breath. 8 g 0   ibuprofen (ADVIL) 600 MG tablet Take 1 tablet (600 mg total) by mouth every 8 (eight) hours as needed. 30 tablet 0   mirabegron ER (MYRBETRIQ) 25 MG TB24 tablet Take 1 tablet (25 mg total) by mouth daily. 56 tablet 0   omeprazole (PRILOSEC) 20 MG capsule Take 1 capsule (20 mg total) by mouth daily. 30 capsule 3   No facility-administered medications prior to visit.    No Known Allergies  Review of Systems  Constitutional:  Negative for chills and fever.  HENT:  Negative for congestion, sinus pressure and sinus pain.   Respiratory:  Negative for cough and shortness of breath.   Gastrointestinal:  Positive for abdominal pain and nausea. Negative for diarrhea and vomiting.  Genitourinary:  Negative for dysuria and hematuria.  Musculoskeletal:  Positive for back pain. Negative for neck pain and neck stiffness.  Skin:  Negative for rash.  Neurological:  Positive for dizziness. Negative for weakness.  Psychiatric/Behavioral:  Negative for agitation and behavioral problems.        Objective:    Physical Exam Constitutional:      General: She is not in acute distress.    Appearance: She is obese. She is not diaphoretic.  HENT:     Head: Normocephalic and atraumatic.     Nose: No congestion.     Mouth/Throat:     Mouth: Mucous membranes are moist.     Pharynx: No posterior oropharyngeal erythema.  Eyes:     Extraocular Movements: Extraocular movements intact.  Cardiovascular:     Rate and Rhythm: Normal rate and regular rhythm.     Heart sounds: Normal heart sounds. No murmur heard. Pulmonary:     Breath sounds: Normal breath sounds. No wheezing or rales.  Abdominal:     General: Bowel sounds are normal.     Palpations: Abdomen is soft.     Tenderness: There is no abdominal tenderness. There is no right CVA tenderness, left CVA tenderness, guarding or rebound.   Musculoskeletal:     Cervical back: Neck supple. No tenderness.     Thoracic back: No tenderness.     Right lower leg: No edema.     Left lower leg: No edema.  Skin:    General: Skin is warm.     Findings: No rash.  Neurological:     General: No focal deficit present.     Mental Status: She is alert and oriented to person, place, and time.  Psychiatric:        Mood  and Affect: Mood normal.        Behavior: Behavior normal.     BP 122/84 (BP Location: Right Arm, Patient Position: Sitting, Cuff Size: Normal)   Pulse 84   Resp 18   Ht '5\' 4"'  (1.626 m)   Wt 193 lb 9.6 oz (87.8 kg)   SpO2 99%   BMI 33.23 kg/m  Wt Readings from Last 3 Encounters:  12/18/21 193 lb 9.6 oz (87.8 kg)  05/11/21 189 lb 9.6 oz (86 kg)  12/01/20 188 lb (85.3 kg)        Assessment & Plan:   Problem List Items Addressed This Visit       Digestive   Gastroesophageal reflux disease - Primary    Has epigastric and LUQ pain, unclear whether gastritis vs referred pain from back Also has nausea with food intake Will give trial of PPI      Relevant Medications   omeprazole (PRILOSEC) 40 MG capsule   meclizine (ANTIVERT) 25 MG tablet     Other   Prediabetes    Lab Results  Component Value Date   HGBA1C 5.6 06/13/2020  Has been trying to lose weight and diet modification Will check CMP and HbA1C before next visit Advised to follow low carb diet      Relevant Orders   Hemoglobin A1c   CMP14+EGFR   Annual physical exam   Relevant Orders   TSH   CMP14+EGFR   CBC with Differential/Platelet   Mixed hyperlipidemia   Relevant Orders   Lipid panel   Vertigo    Her dizziness could be due to vertigo Meclizine as needed Avoid sudden positional changes Advised to maintain adequate hydration and avoid skipping meals      Relevant Medications   meclizine (ANTIVERT) 25 MG tablet   Upper back pain    Complains of left-sided upper back pain Could be paraspinal muscle strain Avoid heavy  lifting and frequent bending Tylenol as needed Advised to wear back brace If persistent, will get imaging and/or PT      Other Visit Diagnoses     Vitamin D deficiency       Relevant Orders   VITAMIN D 25 Hydroxy (Vit-D Deficiency, Fractures)   Need for hepatitis C screening test       Relevant Orders   Hepatitis C Antibody   Need for immunization against influenza       Relevant Orders   Flu Vaccine QUAD 21moIM (Fluarix, Fluzone & Alfiuria Quad PF) (Completed)        Meds ordered this encounter  Medications   omeprazole (PRILOSEC) 40 MG capsule    Sig: Take 1 capsule (40 mg total) by mouth daily.    Dispense:  30 capsule    Refill:  3   meclizine (ANTIVERT) 25 MG tablet    Sig: Take 1 tablet (25 mg total) by mouth 2 (two) times daily as needed for dizziness.    Dispense:  30 tablet    Refill:  0     Lacy Taglieri KKeith Rake MD

## 2021-12-18 NOTE — Assessment & Plan Note (Signed)
Lab Results  Component Value Date   HGBA1C 5.6 06/13/2020   Has been trying to lose weight and diet modification Will check CMP and HbA1C before next visit Advised to follow low carb diet

## 2021-12-18 NOTE — Assessment & Plan Note (Signed)
Her dizziness could be due to vertigo Meclizine as needed Avoid sudden positional changes Advised to maintain adequate hydration and avoid skipping meals

## 2021-12-18 NOTE — Patient Instructions (Signed)
Please take Omeprazole for acid reflux or abdominal pain.  Please take Meclizine as needed for dizziness.  Please avoid sudden positional changes.  Please maintain adequate hydration by taking at least 64 ounces of fluid in a day and avoid skipping meals.  Okay to take Tylenol for back pain.  Please get fasting blood tests done before the next visit.

## 2021-12-18 NOTE — Assessment & Plan Note (Addendum)
Has epigastric and LUQ pain, unclear whether gastritis vs referred pain from back Also has nausea with food intake Will give trial of PPI

## 2021-12-18 NOTE — Assessment & Plan Note (Signed)
Complains of left-sided upper back pain Could be paraspinal muscle strain Avoid heavy lifting and frequent bending Tylenol as needed Advised to wear back brace If persistent, will get imaging and/or PT

## 2022-01-29 ENCOUNTER — Encounter: Payer: Self-pay | Admitting: Internal Medicine

## 2022-01-29 ENCOUNTER — Encounter: Payer: 59 | Admitting: Family Medicine

## 2022-05-30 ENCOUNTER — Encounter: Payer: Self-pay | Admitting: Family Medicine

## 2022-05-30 ENCOUNTER — Ambulatory Visit: Payer: PRIVATE HEALTH INSURANCE | Admitting: Family Medicine

## 2022-05-30 VITALS — BP 166/112 | HR 83 | Ht 62.0 in | Wt 192.0 lb

## 2022-05-30 DIAGNOSIS — N390 Urinary tract infection, site not specified: Secondary | ICD-10-CM | POA: Diagnosis not present

## 2022-05-30 DIAGNOSIS — R399 Unspecified symptoms and signs involving the genitourinary system: Secondary | ICD-10-CM | POA: Diagnosis not present

## 2022-05-30 LAB — POCT URINALYSIS DIP (CLINITEK)
Bilirubin, UA: NEGATIVE
Glucose, UA: NEGATIVE mg/dL
Ketones, POC UA: NEGATIVE mg/dL
Leukocytes, UA: NEGATIVE
Nitrite, UA: NEGATIVE
POC PROTEIN,UA: NEGATIVE
Spec Grav, UA: 1.015 (ref 1.010–1.025)
Urobilinogen, UA: 1 E.U./dL
pH, UA: 7.5 (ref 5.0–8.0)

## 2022-05-30 MED ORDER — SULFAMETHOXAZOLE-TRIMETHOPRIM 800-160 MG PO TABS: 1.0000 | ORAL_TABLET | Freq: Two times a day (BID) | ORAL | 0 refills | Status: AC

## 2022-05-30 NOTE — Patient Instructions (Signed)
It was pleasure meeting with you today. Please take medications as prescribed. Follow up with your primary health provider if any health concerns arises. If symptoms worsen please contact your primary care provider and/or visit the emergency department.  

## 2022-05-30 NOTE — Progress Notes (Signed)
New Patient Office Visit   Subjective   Patient ID: Julie Butler, female    DOB: Dec 09, 1975  Age: 47 y.o. MRN: [id number]  CC:  Chief Complaint  Patient presents with   Urinary Tract Infection    Pt reports sx of uti, urinary urgency since 05/16/2022 has been using azo otc. Also having low back pain.     HPI Julie Butler 47 year old female, presents to the clinic for urinary urgency. She  has a past medical history of Breast nodule (12/08/2014), Breast pain, left (12/08/2014), Elevated BP (12/08/2014), Trichimoniasis (12/16/2014), and Trichimoniasis (12/16/2014).  Urinary Tract Infection  The current episode started 2 weeks ago. Patient reports dysuria and urgency occurs constantly and has been gradually worsening. The quality of the pain is described as aching and burning. The pain is at a severity of 6/10. There has been no fever. Associated symptoms include chills, frequency and urgency. Pertinent negatives include no flank pain, hematuria or nausea. Treatments tried: Azo. The treatment provided mild relief.      Outpatient Encounter Medications as of 05/30/2022  Medication Sig   albuterol (VENTOLIN HFA) 108 (90 Base) MCG/ACT inhaler Inhale 2 puffs into the lungs every 6 (six) hours as needed for wheezing or shortness of breath.   ibuprofen (ADVIL) 600 MG tablet Take 1 tablet (600 mg total) by mouth every 8 (eight) hours as needed.   meclizine (ANTIVERT) 25 MG tablet Take 1 tablet (25 mg total) by mouth 2 (two) times daily as needed for dizziness.   mirabegron ER (MYRBETRIQ) 25 MG TB24 tablet Take 1 tablet (25 mg total) by mouth daily.   omeprazole (PRILOSEC) 40 MG capsule Take 1 capsule (40 mg total) by mouth daily.   sulfamethoxazole-trimethoprim (BACTRIM DS) 800-160 MG tablet Take 1 tablet by mouth 2 (two) times daily for 5 days.   No facility-administered encounter medications on file as of 05/30/2022.    Past Surgical History:  Procedure Laterality Date    TUBAL LIGATION      Review of Systems  Constitutional:  Negative for fever.  Respiratory:  Negative for shortness of breath.   Cardiovascular:  Negative for chest pain.  Genitourinary:  Positive for dysuria, frequency and urgency. Negative for flank pain and hematuria.  Neurological:  Negative for dizziness and headaches.      Objective    BP (!) 166/112   Pulse 83   Ht 5\' 2"  (1.575 m)   Wt 192 lb 0.6 oz (87.1 kg)   SpO2 95%   BMI 35.12 kg/m   Physical Exam Cardiovascular:     Rate and Rhythm: Normal rate and regular rhythm.     Pulses: Normal pulses.     Heart sounds: Normal heart sounds.  Pulmonary:     Effort: Pulmonary effort is normal.     Breath sounds: Normal breath sounds.  Abdominal:     General: Bowel sounds are normal.     Palpations: Abdomen is soft. There is no mass.     Tenderness: There is no right CVA tenderness, left CVA tenderness or guarding.     Comments: Pelvic tenderness  Musculoskeletal:        General: Normal range of motion.  Skin:    General: Skin is warm and dry.     Capillary Refill: Capillary refill takes less than 2 seconds.  Neurological:     General: No focal deficit present.     Mental Status: She is alert.  Psychiatric:  Mood and Affect: Mood normal.        Thought Content: Thought content normal.       Assessment & Plan:  UTI symptoms -     POCT URINALYSIS DIP (CLINITEK) -     Urine Culture  Urinary tract infection without hematuria, site unspecified Assessment & Plan: Urinalysis and urine culture ordered, Prescribed Bactrim x 5 days Urine sent for culture. May take OTC AZO for urinary pain relief. Explained to increase oral fluid intake. Follow up for worsening or persistent symptoms. Patient verbalizes understanding regarding plan of care and all questions answered.   Orders: -     Sulfamethoxazole-Trimethoprim; Take 1 tablet by mouth 2 (two) times daily for 5 days.  Dispense: 10 tablet; Refill: 0    No  follow-ups on file.   Renard Hamper Ria Comment, FNP

## 2022-05-30 NOTE — Assessment & Plan Note (Addendum)
Urinalysis and urine culture ordered, Prescribed Bactrim x 5 days Urine sent for culture. May take OTC AZO for urinary pain relief. Explained to increase oral fluid intake. Follow up for worsening or persistent symptoms. Patient verbalizes understanding regarding plan of care and all questions answered.

## 2022-06-01 LAB — URINE CULTURE: Organism ID, Bacteria: NO GROWTH

## 2022-07-25 ENCOUNTER — Encounter: Payer: Self-pay | Admitting: Adult Health

## 2022-07-25 ENCOUNTER — Ambulatory Visit (INDEPENDENT_AMBULATORY_CARE_PROVIDER_SITE_OTHER): Payer: PRIVATE HEALTH INSURANCE | Admitting: Adult Health

## 2022-07-25 VITALS — BP 139/91 | HR 95 | Ht 62.0 in | Wt 188.0 lb

## 2022-07-25 DIAGNOSIS — N939 Abnormal uterine and vaginal bleeding, unspecified: Secondary | ICD-10-CM | POA: Diagnosis not present

## 2022-07-25 DIAGNOSIS — N951 Menopausal and female climacteric states: Secondary | ICD-10-CM | POA: Diagnosis not present

## 2022-07-25 DIAGNOSIS — D500 Iron deficiency anemia secondary to blood loss (chronic): Secondary | ICD-10-CM

## 2022-07-25 DIAGNOSIS — R232 Flushing: Secondary | ICD-10-CM | POA: Diagnosis not present

## 2022-07-25 DIAGNOSIS — Z1231 Encounter for screening mammogram for malignant neoplasm of breast: Secondary | ICD-10-CM

## 2022-07-25 LAB — POCT HEMOGLOBIN: Hemoglobin: 10.1 g/dL — AB (ref 11–14.6)

## 2022-07-25 MED ORDER — IRON 325 (65 FE) MG PO TABS: 1.0000 | ORAL_TABLET | Freq: Every day | ORAL | 12 refills | Status: DC

## 2022-07-25 MED ORDER — MEGESTROL ACETATE 40 MG PO TABS: ORAL_TABLET | ORAL | 0 refills | Status: DC

## 2022-07-25 NOTE — Progress Notes (Signed)
  Subjective:     Patient ID: Julie Butler, female   DOB: 1975/05/27, 47 y.o.   MRN: [id number]  HPI Julie Butler is a 47 year old Hispanic female, married, G4P4 in complaining of bleeding for 18 days, she has interpreter with her. She missed prior period.   She is self pay.  Last pap was negative HPV,NILM 12/01/20.  PCP is Dr Allena Katz  Review of Systems Bleeding for 18 days  +hot flashes Craving ice Has back pain, has arthritis  Reviewed past medical,surgical, social and family history. Reviewed medications and allergies.     Objective:   Physical Exam BP (!) 139/91 (BP Location: Right Arm, Patient Position: Sitting, Cuff Size: Normal)   Pulse 95   Ht 5\' 2"  (1.575 m)   Wt 188 lb (85.3 kg)   LMP 07/05/2022 (Approximate)   BMI 34.39 kg/m  HGB was 10.1 Skin warm and dry.Pelvic: external genitalia is normal in appearance no lesions, vagina: +blood,urethra has no lesions or masses noted, cervix:smooth and bulbous, uterus: normal size, shape and contour, non tender, no masses felt, adnexa: no masses or tenderness noted. Bladder is non tender and no masses felt.  Fall risk is low  Upstream - 07/25/22 1607       Pregnancy Intention Screening   Does the patient want to become pregnant in the next year? No    Does the patient's partner want to become pregnant in the next year? No    Would the patient like to discuss contraceptive options today? No      Contraception Wrap Up   Current Method Female Sterilization    End Method Female Sterilization            Examination chaperoned by Malachy Mood LPN      Assessment:     1. Screening mammogram for breast cancer Pt to call for appt, go through [employer] program, she is self pay  - MM 3D SCREENING MAMMOGRAM BILATERAL BREAST; Future  2. Abnormal uterine bleeding (AUB) Bleeding for 18 days Missed prior period Will rx megace to stop bleeding  Meds ordered this encounter  Medications   megestrol (MEGACE) 40 MG tablet    Sig:  Take 3 x 5 days then 2 x 5 days then 1 daily till bleeding stops    Dispense:  45 tablet    Refill:  0    Can you do in spanish    Order Specific Question:   Supervising Provider    Answer:   Lazaro Arms [[id number]]   Ferrous Sulfate (IRON) 325 (65 Fe) MG TABS    Sig: Take 1 tablet (325 mg total) by mouth daily.    Dispense:  30 tablet    Refill:  12    Order Specific Question:   Supervising Provider    Answer:   Duane Lope H [[id number]]    - POCT hemoglobin  3. Hot flashes Has some flashes   4. Perimenopause Review handout on perimenopause   5. Iron deficiency anemia due to chronic blood loss Craving ice Hgb 10.1 Will rx iron Eat red meat - POCT hemoglobin     Plan:      Follow up in 6 weeks

## 2022-09-05 ENCOUNTER — Ambulatory Visit: Payer: 59 | Admitting: Adult Health

## 2022-09-24 IMAGING — DX DG LUMBAR SPINE COMPLETE 4+V
5 series · 5 of 5 positions shown · non-contrast
Comparison: None.

CLINICAL DATA: Chronic midline to low back pain with bilateral
sciatica.

EXAM:
LUMBAR SPINE - COMPLETE 4+ VIEW

[l-spine ap]
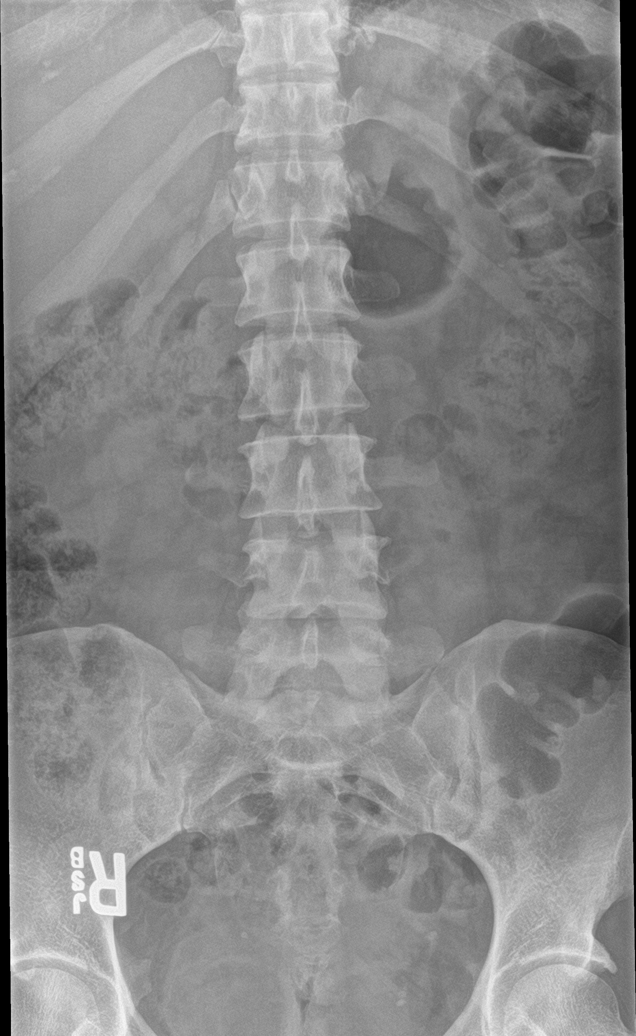

[l-spine obl (1 of 2)]
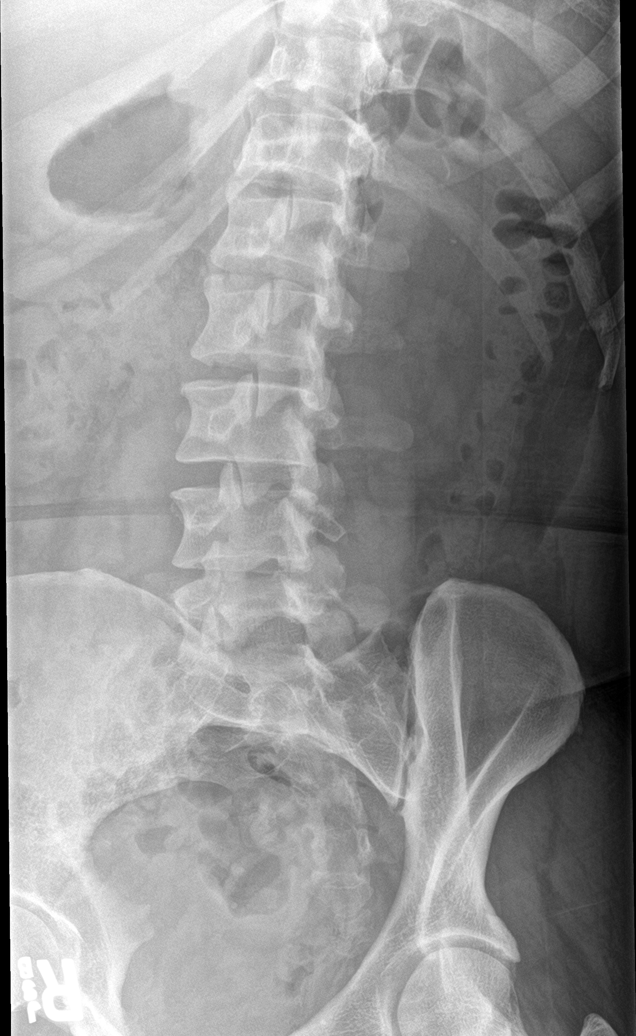

[l-spine obl (2 of 2)]
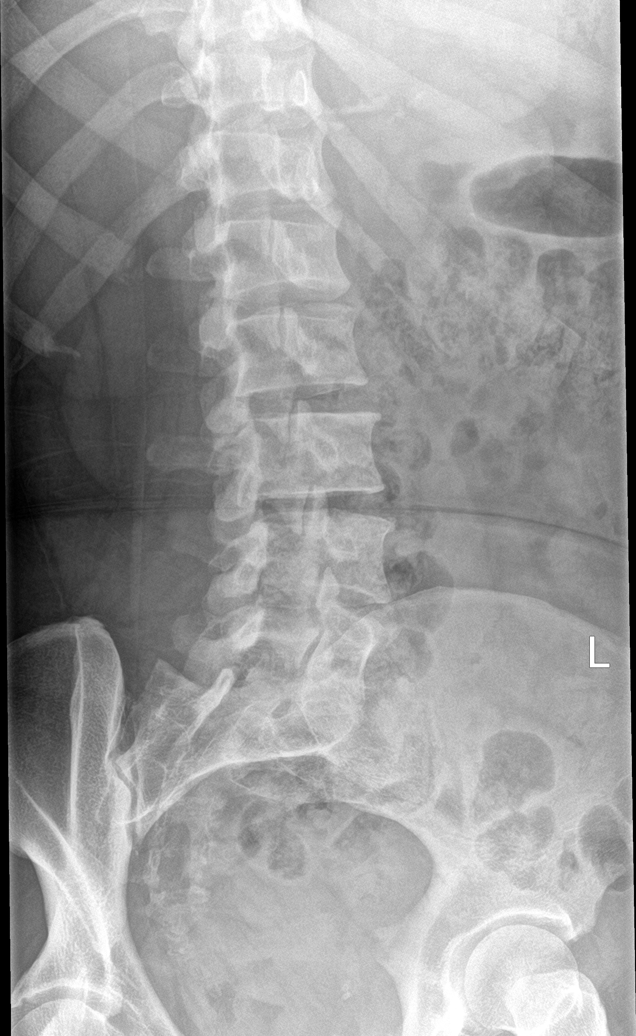

[l-spine lat]
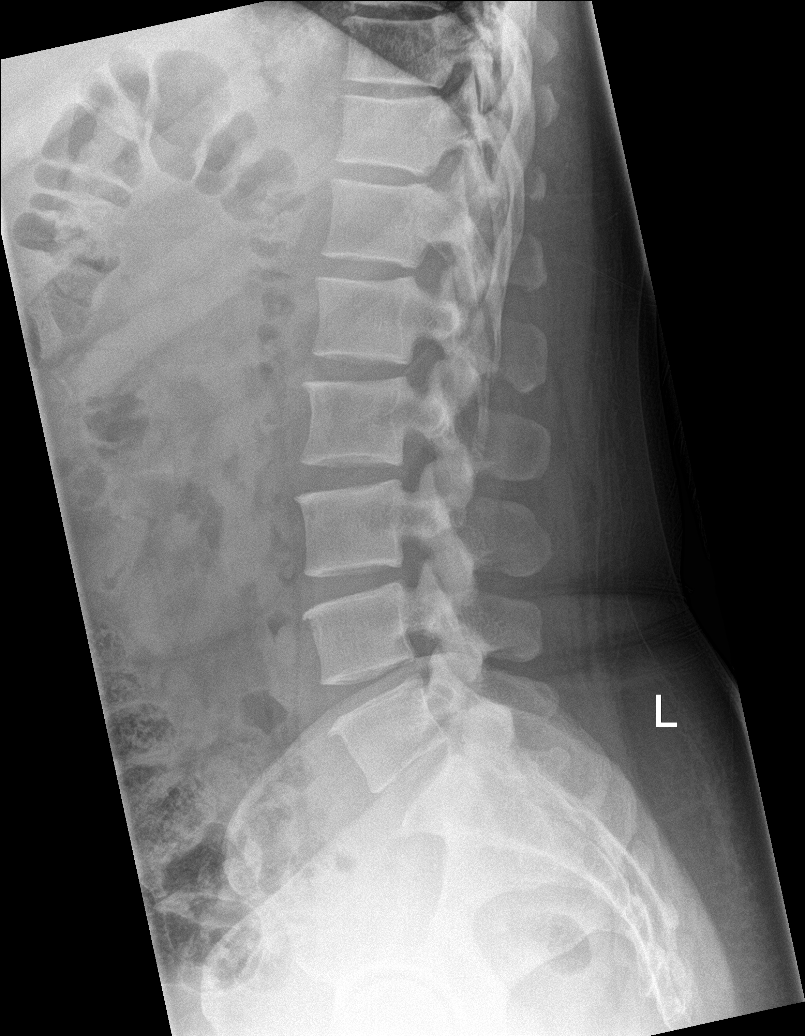

[l-spine spot]
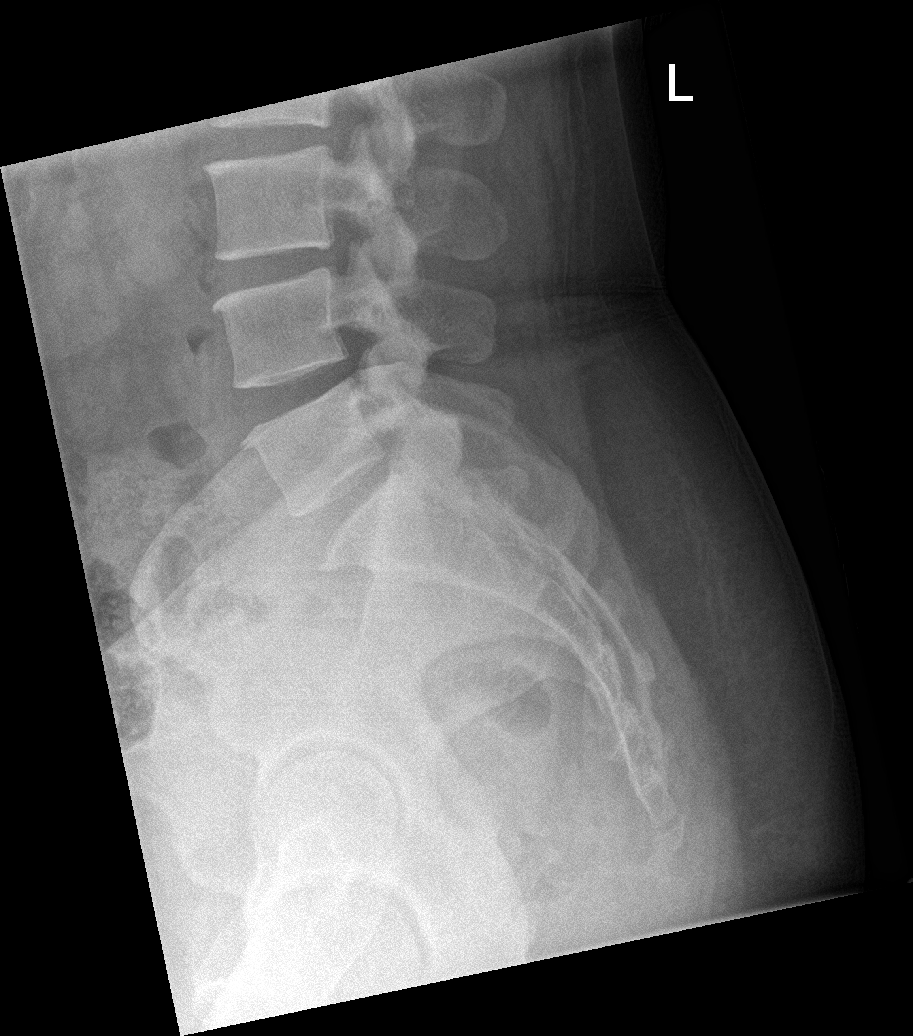

[5 of 5 positions shown; findings below may reference images not displayed]

FINDINGS: Lumbar alignment within normal limits. Possible bilateral pars
defect at L5 but no significant listhesis. Vertebral body heights
are maintained. Disc spaces are patent. Mild degenerative
osteophytes at multiple
IMPRESSION: 1. Minimal degenerative changes.
2. Possible bilateral pars defect at L5

## 2022-10-04 ENCOUNTER — Encounter: Payer: Self-pay | Admitting: Adult Health

## 2022-10-04 ENCOUNTER — Ambulatory Visit (INDEPENDENT_AMBULATORY_CARE_PROVIDER_SITE_OTHER): Payer: 59 | Admitting: Adult Health

## 2022-10-04 VITALS — BP 143/90 | HR 71 | Ht 62.0 in | Wt 193.0 lb

## 2022-10-04 DIAGNOSIS — L83 Acanthosis nigricans: Secondary | ICD-10-CM

## 2022-10-04 DIAGNOSIS — Z862 Personal history of diseases of the blood and blood-forming organs and certain disorders involving the immune mechanism: Secondary | ICD-10-CM

## 2022-10-04 DIAGNOSIS — N939 Abnormal uterine and vaginal bleeding, unspecified: Secondary | ICD-10-CM

## 2022-10-04 DIAGNOSIS — L918 Other hypertrophic disorders of the skin: Secondary | ICD-10-CM

## 2022-10-04 DIAGNOSIS — N951 Menopausal and female climacteric states: Secondary | ICD-10-CM

## 2022-10-04 DIAGNOSIS — R232 Flushing: Secondary | ICD-10-CM | POA: Diagnosis not present

## 2022-10-04 DIAGNOSIS — Z1231 Encounter for screening mammogram for malignant neoplasm of breast: Secondary | ICD-10-CM

## 2022-10-04 LAB — POCT HEMOGLOBIN: Hemoglobin: 12 g/dL (ref 11–14.6)

## 2022-10-04 NOTE — Progress Notes (Signed)
  Subjective:     Patient ID: Julie Butler, female   DOB: 1975/08/06, 47 y.o.   MRN: [id number]  HPI Julie Butler is a 47 year old Hispanic female, married, G4P4 back if follow up on bleeding and it stopped, never took megace, and hot flashes are better.  Has noticed dark skin on neck and more bumps on neck and chest that itch at times. She saw dermatologist in [place] and given cream that helped but hat office closed.  She has interpreter with her.     Component Value Date/Time   DIAGPAP  12/01/2020 1359    - Negative for intraepithelial lesion or malignancy (NILM)   DIAGPAP  11/11/2017 0000    NEGATIVE FOR INTRAEPITHELIAL LESIONS OR MALIGNANCY.   HPVHIGH Negative 12/01/2020 1359   ADEQPAP  12/01/2020 1359    Satisfactory for evaluation; transformation zone component PRESENT.   ADEQPAP  11/11/2017 0000    Satisfactory for evaluation  endocervical/transformation zone component PRESENT.   PCP is Dr Eliane Decree office   Review of Systems Bleeding stopped No hot flashes now Has dark skin at neck Has bumps on neck and chest that itch at times Reviewed past medical,surgical, social and family history. Reviewed medications and allergies.     Objective:   Physical Exam BP (!) 143/90 (BP Location: Right Arm, Patient Position: Sitting, Cuff Size: Normal)   Pulse 71   Ht 5\' 2"  (1.575 m)   Wt 193 lb (87.5 kg)   LMP 09/03/2022   BMI 35.30 kg/m  HGB POC is 12. Skin warm and dry. Neck: has multiple skin tags and acanthosis nigricans. Lungs: clear to ausculation bilaterally. Cardiovascular: regular rate and rhythm.     Fall risk is low  Upstream - 10/04/22 1448       Pregnancy Intention Screening   Does the patient want to become pregnant in the next year? No    Does the patient's partner want to become pregnant in the next year? No    Would the patient like to discuss contraceptive options today? No      Contraception Wrap Up   Current Method Female Sterilization    End Method  Female Sterilization    Contraception Counseling Provided No             Assessment:     1. History of anemia HGB POC 12 Continue OTC iron - POCT hemoglobin  2. Abnormal uterine bleeding (AUB) Bleeding stopped, did not take megace   3. Hot flashes Have resolved  4. Skin tags, multiple acquired Has multiple skin tags, well refer to dermatology  - Ambulatory referral to Dermatology  5. Acanthosis nigricans +dark skin at back of neck  Refer to dermatology in [place]  - Ambulatory referral to Dermatology  6. Screening mammogram for breast cancer Scheduled mammogram for her 10/11/22 at 2:30 pm at [facility]  - MM 3D SCREENING MAMMOGRAM BILATERAL BREAST; Future  7. Perimenopause Review handout in spanish      Plan:     Return in 1 year for pap and physical

## 2022-10-11 ENCOUNTER — Ambulatory Visit (HOSPITAL_COMMUNITY)
Admission: RE | Admit: 2022-10-11 | Discharge: 2022-10-11 | Disposition: A | Discharge status: Home / Self Care | Payer: 59 | Source: Ambulatory Visit | Attending: Adult Health | Admitting: Adult Health

## 2022-10-11 ENCOUNTER — Encounter (HOSPITAL_COMMUNITY): Payer: Self-pay

## 2022-10-11 DIAGNOSIS — Z1231 Encounter for screening mammogram for malignant neoplasm of breast: Secondary | ICD-10-CM | POA: Insufficient documentation

## 2023-02-19 NOTE — Progress Notes (Unsigned)
   Complete physical exam  Patient: Julie Butler   DOB: 1975-11-11   47 y.o. Female  MRN: [id number]  Subjective:    No chief complaint on file.   Julie Butler is a 47 y.o. female who presents today for a complete physical exam. She reports consuming a {diet types:17450} diet. {types:19826} She generally feels {DESC; WELL/FAIRLY WELL/POORLY:18703}. She reports sleeping {DESC; WELL/FAIRLY WELL/POORLY:18703}. She {does/does not:200015} have additional problems to discuss today.    Most recent fall risk assessment:    10/04/2022    2:48 PM  Fall Risk   Falls in the past year? 0  Number falls in past yr: 0  Injury with Fall? 0     Most recent depression screenings:    05/30/2022    3:36 PM 12/18/2021    3:56 PM  PHQ 2/9 Scores  PHQ - 2 Score 0 0  PHQ- 9 Score 0     {VISON DENTAL STD PSA (Optional):27386}  Patient Care Team: Anabel Halon, MD as PCP - General (Internal Medicine) Janalyn Harder, MD (Inactive) as Consulting Physician (Dermatology)   Outpatient Medications Prior to Visit  Medication Sig   albuterol (VENTOLIN HFA) 108 (90 Base) MCG/ACT inhaler Inhale 2 puffs into the lungs every 6 (six) hours as needed for wheezing or shortness of breath. (Patient not taking: Reported on 07/25/2022)   Ferrous Sulfate (IRON) 325 (65 Fe) MG TABS Take 1 tablet (325 mg total) by mouth daily.   megestrol (MEGACE) 40 MG tablet Take 3 x 5 days then 2 x 5 days then 1 daily till bleeding stops (Patient not taking: Reported on 10/04/2022)   No facility-administered medications prior to visit.    ROS     Objective:    There were no vitals taken for this visit. {Vitals History (Optional):23777}  Physical Exam   No results found for any visits on 02/20/23.    Assessment & Plan:    Routine Health Maintenance and Physical Exam  Immunization History  Administered Date(s) Administered   Influenza,inj,Quad PF,6+ Mos 03/14/2020, 12/18/2021   Moderna Sars-Covid-2  Vaccination 06/01/2019, 06/08/2019, 06/08/2019   Tdap 08/05/2019    Health Maintenance  Topic Date Due   Hepatitis C Screening  Never done   INFLUENZA VACCINE  10/04/2022   COVID-19 Vaccine (4 - 2024-25 season) 11/04/2022   Fecal DNA (Cologuard)  06/26/2023   MAMMOGRAM  10/11/2023   Cervical Cancer Screening (HPV/Pap Cotest)  12/01/2025   DTaP/Tdap/Td (2 - Td or Tdap) 08/04/2029   HIV Screening  Completed   HPV VACCINES  Aged Out    Discussed health benefits of physical activity, and encouraged her to engage in regular exercise appropriate for her age and condition.  Encounter for screening for cardiovascular disorders  Need for hepatitis C screening test  Vitamin D deficiency  TSH (thyroid-stimulating hormone deficiency)  IFG (impaired fasting glucose)  Iron deficiency anemia, unspecified iron deficiency anemia type    No follow-ups on file.     Cruzita Lederer Newman Nip, FNP

## 2023-02-20 ENCOUNTER — Encounter: Payer: Self-pay | Admitting: Family Medicine

## 2023-02-20 ENCOUNTER — Ambulatory Visit: Payer: 59 | Admitting: Family Medicine

## 2023-02-20 ENCOUNTER — Telehealth: Payer: Self-pay | Admitting: Internal Medicine

## 2023-02-20 VITALS — BP 135/89 | HR 93 | Ht 62.0 in | Wt 192.1 lb

## 2023-02-20 DIAGNOSIS — R7301 Impaired fasting glucose: Secondary | ICD-10-CM

## 2023-02-20 DIAGNOSIS — E038 Other specified hypothyroidism: Secondary | ICD-10-CM

## 2023-02-20 DIAGNOSIS — Z1159 Encounter for screening for other viral diseases: Secondary | ICD-10-CM

## 2023-02-20 DIAGNOSIS — B349 Viral infection, unspecified: Secondary | ICD-10-CM | POA: Diagnosis not present

## 2023-02-20 DIAGNOSIS — Z136 Encounter for screening for cardiovascular disorders: Secondary | ICD-10-CM

## 2023-02-20 DIAGNOSIS — D509 Iron deficiency anemia, unspecified: Secondary | ICD-10-CM

## 2023-02-20 DIAGNOSIS — E559 Vitamin D deficiency, unspecified: Secondary | ICD-10-CM

## 2023-02-20 MED ORDER — PROMETHAZINE-DM 6.25-15 MG/5ML PO SYRP
5.0000 mL | ORAL_SOLUTION | Freq: Four times a day (QID) | ORAL | 0 refills | Status: DC | PRN
Start: 2023-02-20 — End: 2023-11-18

## 2023-02-20 MED ORDER — AZELASTINE-FLUTICASONE 137-50 MCG/ACT NA SUSP: 1.0000 | Freq: Two times a day (BID) | NASAL | 1 refills | Status: DC

## 2023-02-20 MED ORDER — LEVOCETIRIZINE DIHYDROCHLORIDE 5 MG PO TABS: 5.0000 mg | ORAL_TABLET | Freq: Every evening | ORAL | 1 refills | Status: DC

## 2023-02-20 NOTE — Progress Notes (Signed)
Established Patient Office Visit   Subjective  Patient ID: Julie Butler, female    DOB: [birth year]  Age: 47 y.o. MRN: [id number]  Chief Complaint  Patient presents with   Acute Visit    Cold and Sinus problems : cough, sneezing, runny nose, eye and ear pain, headache, upper back pain x 2 days.,     She  has a past medical history of Breast nodule (12/08/2014), Breast pain, left (12/08/2014), Elevated BP (12/08/2014), Trichimoniasis (12/16/2014), and Trichimoniasis (12/16/2014).  Patient complains of low grade fever and persistentcough. Patient describes symptoms of shortness of breath on exertion, chills without rigors, facial pain, sinus pressure,cough, fatigue, headache, malaise, myalgias, sore throat, and sputum production. Symptoms began 1 day ago and are gradually worsening since that time. Patient denies chest pain or nausea and vomiting. Treatment thus far includes OTC analgesics/antipyretics: not very effective Past pulmonary history: pneumonia a few year ago.    Review of Systems  Constitutional:  Positive for fever and malaise/fatigue. Negative for chills.  HENT:  Positive for congestion and sinus pain.   Eyes:  Negative for blurred vision.  Respiratory:  Positive for cough. Negative for sputum production.   Neurological:  Positive for headaches. Negative for dizziness.      Objective:     BP 135/89   Pulse 93   Ht 5\' 2"  (1.575 m)   Wt 192 lb 1.3 oz (87.1 kg)   LMP 02/06/2023 (Exact Date)   SpO2 97%   BMI 35.13 kg/m  BP Readings from Last 3 Encounters:  02/20/23 135/89  10/04/22 (!) 143/90  07/25/22 (!) 139/91      Physical Exam Vitals reviewed.  Constitutional:      General: She is not in acute distress.    Appearance: Normal appearance. She is not ill-appearing, toxic-appearing or diaphoretic.  HENT:     Head: Normocephalic.     Right Ear: Tympanic membrane normal.     Left Ear: Tympanic membrane normal.     Nose: Congestion and rhinorrhea  present.     Mouth/Throat:     Pharynx: Posterior oropharyngeal erythema present.  Eyes:     General:        Right eye: No discharge.        Left eye: No discharge.     Conjunctiva/sclera: Conjunctivae normal.  Cardiovascular:     Rate and Rhythm: Normal rate.     Pulses: Normal pulses.     Heart sounds: Normal heart sounds.  Pulmonary:     Effort: Pulmonary effort is normal. No respiratory distress.     Breath sounds: Normal breath sounds.  Musculoskeletal:     Cervical back: Tenderness present.  Lymphadenopathy:     Cervical: Cervical adenopathy present.  Skin:    General: Skin is warm and dry.     Capillary Refill: Capillary refill takes less than 2 seconds.  Neurological:     Mental Status: She is alert.  Psychiatric:        Mood and Affect: Mood normal.        Behavior: Behavior normal.      No results found for any visits on 02/20/23.  The 10-year ASCVD risk score (Arnett DK, et al., 2019) is: 1.2%    Assessment & Plan:  Viral illness Assessment & Plan: COVID-19, Flu A+B and RSV ordered   Azelastine-fluticasone nasal spray PRN Xyzal 5 mg every evening,  promethazine syrup PRN Advise patient to rest to support your body's recovery. Stay hydrated by drinking  water, tea, or broth. Using a humidifier can help soothe throat irritation and ease nasal congestion. For fever or pain, acetaminophen (Tylenol) is recommended. To relieve other symptoms, try saline nasal sprays, throat lozenges, or gargling with saltwater. Focus on eating light, healthy meals like fruits and vegetables to keep your strength up. Practice good hygiene by washing your hands frequently and covering your mouth when coughing or sneezing.Follow-up for worsening or persistent symptoms. Patient verbalizes understanding regarding plan of care and all questions answered    Orders: -     COVID-19, Flu A+B and RSV -     Promethazine-DM; Take 5 mLs by mouth 4 (four) times daily as needed.  Dispense: 118 mL;  Refill: 0 -     Azelastine-Fluticasone; Place 1 spray into the nose every 12 (twelve) hours.  Dispense: 23 g; Refill: 1  Other orders -     Levocetirizine Dihydrochloride; Take 1 tablet (5 mg total) by mouth every evening.  Dispense: 30 tablet; Refill: 1    Return if symptoms worsen or fail to improve.   Cruzita Lederer Newman Nip, FNP

## 2023-02-20 NOTE — Assessment & Plan Note (Signed)
COVID-19, Flu A+B and RSV ordered   Azelastine-fluticasone nasal spray PRN Xyzal 5 mg every evening,  promethazine syrup PRN Advise patient to rest to support your body's recovery. Stay hydrated by drinking water, tea, or broth. Using a humidifier can help soothe throat irritation and ease nasal congestion. For fever or pain, acetaminophen (Tylenol) is recommended. To relieve other symptoms, try saline nasal sprays, throat lozenges, or gargling with saltwater. Focus on eating light, healthy meals like fruits and vegetables to keep your strength up. Practice good hygiene by washing your hands frequently and covering your mouth when coughing or sneezing.Follow-up for worsening or persistent symptoms. Patient verbalizes understanding regarding plan of care and all questions answered

## 2023-02-20 NOTE — Patient Instructions (Signed)

## 2023-02-20 NOTE — Telephone Encounter (Signed)
Patient wanting to switch to [place] due to her being able to speak Spanish- is this okay? Please advise Thanks

## 2023-02-20 NOTE — Telephone Encounter (Signed)
That's fine

## 2023-02-22 LAB — COVID-19, FLU A+B AND RSV
Influenza A, NAA: NOT DETECTED
Influenza B, NAA: NOT DETECTED
RSV, NAA: NOT DETECTED
SARS-CoV-2, NAA: NOT DETECTED

## 2023-04-10 NOTE — Patient Instructions (Signed)

## 2023-04-10 NOTE — Progress Notes (Signed)
 Established Patient Office Visit   Subjective  Patient ID: Julie Butler, female    DOB: 07-06-1975  Age: 48 y.o. MRN: [id number]  Chief Complaint  Patient presents with   Establish Care    Pain in back from neck to hip Upper pain started due to cough. Lower back started after.     She  has a past medical history of Breast nodule (12/08/2014), Breast pain, left (12/08/2014), Elevated BP (12/08/2014), Trichimoniasis (12/16/2014), and Trichimoniasis (12/16/2014).  Back Pain This is a chronic problem. The problem occurs intermittently. The problem has been gradually worsening since onset. The pain is present in the lumbar spine. The quality of the pain is described as aching and cramping. The pain does not radiate. The pain is at a severity of 8/10. The pain is Worse during the day. The symptoms are aggravated by position and sitting. Pertinent negatives include no abdominal pain, bladder incontinence, bowel incontinence, chest pain, fever, headaches, leg pain or pelvic pain. Risk factors include obesity. She has tried NSAIDs for the symptoms. The treatment provided no relief.    Review of Systems  Constitutional:  Negative for chills and fever.  Eyes:  Negative for blurred vision.  Respiratory:  Negative for shortness of breath.   Cardiovascular:  Negative for chest pain.  Gastrointestinal:  Negative for abdominal pain and bowel incontinence.  Genitourinary:  Negative for bladder incontinence and pelvic pain.  Musculoskeletal:  Positive for back pain and myalgias.  Neurological:  Negative for dizziness and headaches.      Objective:     BP 138/87   Pulse 87   Ht 5' 2 (1.575 m)   Wt 194 lb (88 kg)   LMP 02/05/2023 (Approximate)   SpO2 97%   BMI 35.48 kg/m  BP Readings from Last 3 Encounters:  04/11/23 138/87  02/20/23 135/89  10/04/22 (!) 143/90      Physical Exam Vitals reviewed.  Constitutional:      General: She is not in acute distress.    Appearance:  Normal appearance. She is not ill-appearing, toxic-appearing or diaphoretic.  HENT:     Head: Normocephalic.  Eyes:     General:        Right eye: No discharge.        Left eye: No discharge.     Conjunctiva/sclera: Conjunctivae normal.  Cardiovascular:     Rate and Rhythm: Normal rate.     Pulses: Normal pulses.     Heart sounds: Normal heart sounds.  Pulmonary:     Effort: Pulmonary effort is normal. No respiratory distress.     Breath sounds: Normal breath sounds.  Abdominal:     General: Bowel sounds are normal.     Palpations: Abdomen is soft.     Tenderness: There is no abdominal tenderness. There is no right CVA tenderness, left CVA tenderness or guarding.  Musculoskeletal:     Cervical back: Normal range of motion.     Lumbar back: No tenderness. Decreased range of motion. Negative right straight leg raise test and negative left straight leg raise test.  Skin:    General: Skin is warm and dry.     Capillary Refill: Capillary refill takes less than 2 seconds.  Neurological:     General: No focal deficit present.     Mental Status: She is alert and oriented to person, place, and time.     Coordination: Coordination normal.     Gait: Gait normal.  Psychiatric:  Mood and Affect: Mood normal.        Behavior: Behavior normal.      No results found for any visits on 04/11/23.  The 10-year ASCVD risk score (Arnett DK, et al., 2019) is: 1.2%    Assessment & Plan:  Need for hepatitis C screening test -     Hepatitis C antibody  Vitamin D  deficiency -     VITAMIN D  25 Hydroxy (Vit-D Deficiency, Fractures)  TSH (thyroid-stimulating hormone deficiency) -     TSH + free T4  Encounter for screening for cardiovascular disorders -     Lipid panel -     CMP14+EGFR -     CBC with Differential/Platelet  IFG (impaired fasting glucose) -     Hemoglobin A1c  Lumbar pain Assessment & Plan: Trial on Flexeril  5 mg once daily We discussed the desired effects and  potential side effects of the prescribed medication for back pain. Additionally, we reviewed non-pharmacological interventions, including the importance of rest, avoiding twisting, improper bending, and straining the lower back. I demonstrated proper body mechanics to prevent further injury and advised alternating between ice and heat therapy for relief. Stretching exercises for both the back and legs were recommended to improve flexibility and support recovery. The patient was advised to follow up if symptoms worsen or persist. The patient expressed understanding of the treatment plan, and all questions were thoroughly addressed.   Orders: -     Cyclobenzaprine  HCl; Take 1 tablet (5 mg total) by mouth 3 (three) times daily as needed.  Dispense: 30 tablet; Refill: 1  Acne vulgaris Assessment & Plan: Trial on tretinoin  cream at bedtime Discussed to  gently cleanse your skin twice a day with a mild cleanser, avoid picking or squeezing pimples, and use non-comedogenic skincare products.    Other orders -     Tretinoin ; Apply topically at bedtime.  Dispense: 45 g; Refill: 1    Return in about 6 months (around 10/09/2023), or if symptoms worsen or fail to improve, for Annual Physical, Follow up.   Hilario Kidd Wilhelmena Falter, FNP

## 2023-04-11 ENCOUNTER — Encounter: Payer: Self-pay | Admitting: Family Medicine

## 2023-04-11 ENCOUNTER — Ambulatory Visit: Payer: No Typology Code available for payment source | Admitting: Family Medicine

## 2023-04-11 VITALS — BP 138/87 | HR 87 | Ht 62.0 in | Wt 194.0 lb

## 2023-04-11 DIAGNOSIS — L709 Acne, unspecified: Secondary | ICD-10-CM | POA: Insufficient documentation

## 2023-04-11 DIAGNOSIS — E559 Vitamin D deficiency, unspecified: Secondary | ICD-10-CM

## 2023-04-11 DIAGNOSIS — Z23 Encounter for immunization: Secondary | ICD-10-CM | POA: Diagnosis not present

## 2023-04-11 DIAGNOSIS — R7301 Impaired fasting glucose: Secondary | ICD-10-CM

## 2023-04-11 DIAGNOSIS — E038 Other specified hypothyroidism: Secondary | ICD-10-CM | POA: Diagnosis not present

## 2023-04-11 DIAGNOSIS — M545 Low back pain, unspecified: Secondary | ICD-10-CM

## 2023-04-11 DIAGNOSIS — L700 Acne vulgaris: Secondary | ICD-10-CM

## 2023-04-11 DIAGNOSIS — Z1159 Encounter for screening for other viral diseases: Secondary | ICD-10-CM

## 2023-04-11 DIAGNOSIS — Z136 Encounter for screening for cardiovascular disorders: Secondary | ICD-10-CM

## 2023-04-11 MED ORDER — CYCLOBENZAPRINE HCL 5 MG PO TABS: 5.0000 mg | ORAL_TABLET | Freq: Three times a day (TID) | ORAL | 1 refills | Status: DC | PRN

## 2023-04-11 MED ORDER — TRETINOIN 0.025 % EX CREA: TOPICAL_CREAM | Freq: Every day | CUTANEOUS | 1 refills | Status: DC

## 2023-04-11 NOTE — Assessment & Plan Note (Signed)
 Trial on tretinoin  cream at bedtime Discussed to  gently cleanse your skin twice a day with a mild cleanser, avoid picking or squeezing pimples, and use non-comedogenic skincare products.

## 2023-04-11 NOTE — Assessment & Plan Note (Signed)
 Trial on Flexeril  5 mg once daily We discussed the desired effects and potential side effects of the prescribed medication for back pain. Additionally, we reviewed non-pharmacological interventions, including the importance of rest, avoiding twisting, improper bending, and straining the lower back. I demonstrated proper body mechanics to prevent further injury and advised alternating between ice and heat therapy for relief. Stretching exercises for both the back and legs were recommended to improve flexibility and support recovery. The patient was advised to follow up if symptoms worsen or persist. The patient expressed understanding of the treatment plan, and all questions were thoroughly addressed.

## 2023-04-13 LAB — CBC WITH DIFFERENTIAL/PLATELET
Basophils Absolute: 0 10*3/uL (ref 0.0–0.2)
Basos: 1 %
EOS (ABSOLUTE): 0.2 10*3/uL (ref 0.0–0.4)
Eos: 3 %
Hematocrit: 35.3 % (ref 34.0–46.6)
Hemoglobin: 10 g/dL — ABNORMAL LOW (ref 11.1–15.9)
Immature Grans (Abs): 0 10*3/uL (ref 0.0–0.1)
Immature Granulocytes: 0 %
Lymphocytes Absolute: 2 10*3/uL (ref 0.7–3.1)
Lymphs: 38 %
MCH: 19.6 pg — ABNORMAL LOW (ref 26.6–33.0)
MCHC: 28.3 g/dL — ABNORMAL LOW (ref 31.5–35.7)
MCV: 69 fL — ABNORMAL LOW (ref 79–97)
Monocytes Absolute: 0.3 10*3/uL (ref 0.1–0.9)
Monocytes: 5 %
Neutrophils Absolute: 2.9 10*3/uL (ref 1.4–7.0)
Neutrophils: 53 %
Platelets: 374 10*3/uL (ref 150–450)
RBC: 5.1 x10E6/uL (ref 3.77–5.28)
RDW: 16.5 % — ABNORMAL HIGH (ref 11.7–15.4)
WBC: 5.4 10*3/uL (ref 3.4–10.8)

## 2023-04-13 LAB — CMP14+EGFR
ALT: 22 [IU]/L (ref 0–32)
AST: 22 [IU]/L (ref 0–40)
Albumin: 4 g/dL (ref 3.9–4.9)
Alkaline Phosphatase: 105 [IU]/L (ref 44–121)
BUN/Creatinine Ratio: 15 (ref 9–23)
BUN: 9 mg/dL (ref 6–24)
Bilirubin Total: 0.4 mg/dL (ref 0.0–1.2)
CO2: 21 mmol/L (ref 20–29)
Calcium: 8.7 mg/dL (ref 8.7–10.2)
Chloride: 103 mmol/L (ref 96–106)
Creatinine, Ser: 0.62 mg/dL (ref 0.57–1.00)
Globulin, Total: 3.2 g/dL (ref 1.5–4.5)
Glucose: 95 mg/dL (ref 70–99)
Potassium: 4.3 mmol/L (ref 3.5–5.2)
Sodium: 140 mmol/L (ref 134–144)
Total Protein: 7.2 g/dL (ref 6.0–8.5)
eGFR: 110 mL/min/{1.73_m2} (ref >59)

## 2023-04-13 LAB — LIPID PANEL
Chol/HDL Ratio: 3.4 {ratio} (ref 0.0–4.4)
Cholesterol, Total: 157 mg/dL (ref 100–199)
HDL: 46 mg/dL (ref >39)
LDL Chol Calc (NIH): 87 mg/dL (ref 0–99)
Triglycerides: 139 mg/dL (ref 0–149)
VLDL Cholesterol Cal: 24 mg/dL (ref 5–40)

## 2023-04-13 LAB — TSH+FREE T4
Free T4: 0.94 ng/dL (ref 0.82–1.77)
TSH: 2.53 u[IU]/mL (ref 0.450–4.500)

## 2023-04-13 LAB — HEMOGLOBIN A1C
Est. average glucose Bld gHb Est-mCnc: 126 mg/dL
Hgb A1c MFr Bld: 6 % — ABNORMAL HIGH (ref 4.8–5.6)

## 2023-04-13 LAB — VITAMIN D 25 HYDROXY (VIT D DEFICIENCY, FRACTURES): Vit D, 25-Hydroxy: 20.5 ng/mL — ABNORMAL LOW (ref 30.0–100.0)

## 2023-04-13 LAB — HEPATITIS C ANTIBODY: Hep C Virus Ab: NONREACTIVE

## 2023-04-16 ENCOUNTER — Encounter: Payer: Self-pay | Admitting: Family Medicine

## 2023-06-09 ENCOUNTER — Other Ambulatory Visit: Payer: Self-pay | Admitting: Family Medicine

## 2023-10-09 ENCOUNTER — Encounter: Payer: No Typology Code available for payment source | Admitting: Internal Medicine

## 2023-10-24 ENCOUNTER — Ambulatory Visit: Payer: 59 | Admitting: Dermatology

## 2023-10-28 ENCOUNTER — Ambulatory Visit (INDEPENDENT_AMBULATORY_CARE_PROVIDER_SITE_OTHER): Admitting: Dermatology

## 2023-10-28 DIAGNOSIS — Q825 Congenital non-neoplastic nevus: Secondary | ICD-10-CM

## 2023-10-28 DIAGNOSIS — L820 Inflamed seborrheic keratosis: Secondary | ICD-10-CM

## 2023-10-28 DIAGNOSIS — W908XXA Exposure to other nonionizing radiation, initial encounter: Secondary | ICD-10-CM | POA: Diagnosis not present

## 2023-10-28 DIAGNOSIS — D229 Melanocytic nevi, unspecified: Secondary | ICD-10-CM | POA: Diagnosis not present

## 2023-10-28 DIAGNOSIS — L578 Other skin changes due to chronic exposure to nonionizing radiation: Secondary | ICD-10-CM | POA: Diagnosis not present

## 2023-10-28 DIAGNOSIS — L821 Other seborrheic keratosis: Secondary | ICD-10-CM | POA: Diagnosis not present

## 2023-10-28 DIAGNOSIS — D2239 Melanocytic nevi of other parts of face: Secondary | ICD-10-CM

## 2023-10-28 NOTE — Progress Notes (Unsigned)
 New Patient Visit   Subjective  Julie Butler is a 48 y.o. female who presents for the following: patient is here with daughter who contributes to history   Reports spots on right cheek, and other spots at face, neck and abdomen she would like checked.  The patient has spots, moles and lesions to be evaluated, some may be new or changing and the patient may have concern these could be cancer.  Left and right neck Photos today of face neck  The following portions of the chart were reviewed this encounter and updated as appropriate: medications, allergies, medical history  Review of Systems:  No other skin or systemic complaints except as noted in HPI or Assessment and Plan.  Objective  Well appearing patient in no apparent distress; mood and affect are within normal limits.   A focused examination was performed of the following areas: Face, neck, abdomen   Relevant exam findings are noted in the Assessment and Plan. Congenital nevus left forehead   Isks and Sks at face and r/l neck               right cheek x 1, left cheek x 1, left nasolabial x 1,  b/l neck x 20 (23) Erythematous stuck-on, waxy papule or plaque  Assessment & Plan   SEBORRHEIC KERATOSIS At abdomen, face, neck - Stuck-on, waxy, tan-brown papules and/or plaques  - Benign-appearing - Discussed benign etiology and prognosis. 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  Stable. Observation.  Call clinic for new or changing moles.   Recommend daily use of broad spectrum spf 30+ sunscreen to sun-exposed areas.    ABCDEs of mole observation discussed and patient handout given.  RTC if any changes noted.  MELANOCYTIC NEVI Exam: Tan-brown and/or pink-flesh-colored symmetric macules and papules Treatment Plan: Benign appearing on exam today. Recommend  observation. Call clinic for new or changing moles. Recommend daily use of broad spectrum spf 30+ sunscreen to sun-exposed areas.    ACTINIC DAMAGE - chronic, secondary to cumulative UV radiation exposure/sun exposure over time - diffuse scaly erythematous macules with underlying dyspigmentation - Recommend daily broad spectrum sunscreen SPF 30+ to sun-exposed areas, reapply every 2 hours as needed.  - Recommend staying in the shade or wearing long sleeves, sun glasses (UVA+UVB protection) and wide brim hats (4-inch brim around the entire circumference of the hat). - Call for new or changing lesions.  INFLAMED SEBORRHEIC KERATOSIS (23) right cheek x 1, left cheek x 1, left nasolabial x 1,  b/l neck x 20 (23) Symptomatic, irritating, patient would like treated.  Discussed others can be treated at face, neck and abdomen at next follow up in 10 - 11 weeks Destruction of lesion - right cheek x 1, left cheek x 1, left nasolabial x 1,  b/l neck x 20 (23) Complexity: simple   Destruction method: cryotherapy   Informed consent: discussed and consent obtained   Timeout:  patient name, date of birth, surgical site, and procedure verified Lesion destroyed using liquid nitrogen: Yes   Region frozen until ice ball extended beyond lesion: Yes   Outcome: patient tolerated procedure well with no complications   Post-procedure details: wound care instructions given    SEBORRHEIC KERATOSIS   MELANOCYTIC NEVUS OF FACE, OTHER LOCATION   ACTINIC SKIN DAMAGE    Return in about 3 months (around 01/28/2024)  for isk follow up.  IEleanor Blush, CMA, am acting as scribe for Alm Rhyme, MD.  Documentation: I have reviewed the above documentation for accuracy and completeness, and I agree with the above.  Alm Rhyme, MD

## 2023-10-28 NOTE — Patient Instructions (Addendum)
 Seborrheic Keratosis  What causes seborrheic keratoses? Seborrheic keratoses are harmless, common skin growths that first appear during adult life.  As time goes by, more growths appear.  Some people may develop a large number of them.  Seborrheic keratoses appear on both covered and uncovered body parts.  They are not caused by sunlight.  The tendency to develop seborrheic keratoses can be inherited.  They vary in color from skin-colored to gray, brown, or even black.  They can be either smooth or have a rough, warty surface.   Seborrheic keratoses are superficial and look as if they were stuck on the skin.  Under the microscope this type of keratosis looks like layers upon layers of skin.  That is why at times the top layer may seem to fall off, but the rest of the growth remains and re-grows.    Treatment Seborrheic keratoses do not need to be treated, but can easily be removed in the office.  Seborrheic keratoses often cause symptoms when they rub on clothing or jewelry.  Lesions can be in the way of shaving.  If they become inflamed, they can cause itching, soreness, or burning.  Removal of a seborrheic keratosis can be accomplished by freezing, burning, or surgery. If any spot bleeds, scabs, or grows rapidly, please return to have it checked, as these can be an indication of a skin cancer.   Cryotherapy Aftercare  Wash gently with soap and water everyday.   Apply Vaseline and Band-Aid daily until healed.     Due to recent changes in healthcare laws, you may see results of your pathology and/or laboratory studies on MyChart before the doctors have had a chance to review them. We understand that in some cases there may be results that are confusing or concerning to you. Please understand that not all results are received at the same time and often the doctors may need to interpret multiple results in order to provide you with the best plan of care or course of treatment. Therefore, we ask  that you please give us  2 business days to thoroughly review all your results before contacting the office for clarification. Should we see a critical lab result, you will be contacted sooner.   If You Need Anything After Your Visit  If you have any questions or concerns for your doctor, please call our main line at [phone] and press option 4 to reach your doctor's medical assistant. If no one answers, please leave a voicemail as directed and we will return your call as soon as possible. Messages left after 4 pm will be answered the following business day.   You may also send us  a message via MyChart. We typically respond to MyChart messages within 1-2 business days.  For prescription refills, please ask your pharmacy to contact our office. Our fax number is [phone].  If you have an urgent issue when the clinic is closed that cannot wait until the next business day, you can page your doctor at the number below.    Please note that while we do our best to be available for urgent issues outside of office hours, we are not available 24/7.   If you have an urgent issue and are unable to reach us , you may choose to seek medical care at your doctor's office, retail clinic, urgent care center, or emergency room.  If you have a medical emergency, please immediately call 911 or go to the emergency department.  Pager Numbers  - Dr. Hester:  [phone]  - Dr. Jackquline: [phone]  - Dr. Claudene: [phone]   - Dr. Raymund: [phone]  In the event of inclement weather, please call our main line at [phone] for an update on the status of any delays or closures.  Dermatology Medication Tips: Please keep the boxes that topical medications come in in order to help keep track of the instructions about where and how to use these. Pharmacies typically print the medication instructions only on the boxes and not directly on the medication tubes.   If your medication is too expensive,  please contact our office at [phone] option 4 or send us  a message through MyChart.   We are unable to tell what your co-pay for medications will be in advance as this is different depending on your insurance coverage. However, we may be able to find a substitute medication at lower cost or fill out paperwork to get insurance to cover a needed medication.   If a prior authorization is required to get your medication covered by your insurance company, please allow us  1-2 business days to complete this process.  Drug prices often vary depending on where the prescription is filled and some pharmacies may offer cheaper prices.  The website www.goodrx.com contains coupons for medications through different pharmacies. The prices here do not account for what the cost may be with help from insurance (it may be cheaper with your insurance), but the website can give you the price if you did not use any insurance.  - You can print the associated coupon and take it with your prescription to the pharmacy.  - You may also stop by our office during regular business hours and pick up a GoodRx coupon card.  - If you need your prescription sent electronically to a different pharmacy, notify our office through [facility] or by phone at [phone] option 4.     Si Usted Necesita Algo Despus de Su Visita  Tambin puede enviarnos un mensaje a travs de Clinical cytogeneticist. Por lo general respondemos a los mensajes de MyChart en el transcurso de 1 a 2 das hbiles.  Para renovar recetas, por favor pida a su farmacia que se ponga en contacto con nuestra oficina. Randi lakes de fax es [place] [phone].  Si tiene un asunto urgente cuando la clnica est cerrada y que no puede esperar hasta el siguiente da hbil, puede llamar/localizar a su doctor(a) al nmero que aparece a continuacin.   Por favor, tenga en cuenta que aunque hacemos todo lo posible para estar disponibles para asuntos urgentes fuera del  horario de [place], no estamos disponibles las 24 horas del da, los 7 [address]  [address] de la [place].   Si tiene un problema urgente y no puede comunicarse con nosotros, puede optar por buscar atencin mdica  en el consultorio de su doctor(a), en una clnica privada, en un centro de atencin urgente o en una sala de emergencias.  Si tiene Engineer, drilling, por favor llame inmediatamente al 911 o vaya a la sala de emergencias.  Nmeros de bper  - Dr. Hester: [phone]  - Dra. Jackquline: [phone]  - Dr. Claudene: [phone]  - Dra. Kitts: [phone]  En caso de inclemencias del [place], por favor llame a nuestra lnea principal al [phone] para una actualizacin sobre el estado de cualquier retraso o cierre.  Consejos para la medicacin en dermatologa: Por favor, guarde las cajas en las que vienen los medicamentos de uso tpico para ayudarle a seguir las instrucciones sobre dnde y cmo  usarlos. Las farmacias generalmente imprimen las instrucciones del medicamento slo en las cajas y no directamente en los tubos del [place].   Si su medicamento es muy caro, por favor, pngase en contacto con landry rieger llamando al [phone] y presione la opcin 4 o envenos un mensaje a travs de Clinical cytogeneticist.   No podemos decirle cul ser su copago por los medicamentos por adelantado ya que esto es diferente dependiendo de la cobertura de su seguro. Sin embargo, es posible que podamos encontrar un medicamento sustituto a Audiological scientist un formulario para que el seguro cubra el medicamento que se considera necesario.   Si se requiere una autorizacin previa para que su compaa de seguros malta su medicamento, por favor permtanos de 1 a 2 das hbiles para completar este proceso.  Los precios de los medicamentos varan con frecuencia dependiendo del Environmental consultant de dnde se surte la receta y alguna farmacias pueden ofrecer precios ms baratos.  El sitio web www.goodrx.com tiene cupones para  medicamentos de Health and safety inspector. Los precios aqu no tienen en cuenta lo que podra costar con la ayuda del seguro (puede ser ms barato con su seguro), pero el sitio web puede darle el precio si no utiliz Tourist information centre manager.  - Puede imprimir el cupn correspondiente y llevarlo con su receta a la farmacia.  - Tambin puede pasar por nuestra oficina durante el horario de atencin regular y Education officer, museum una tarjeta de cupones de GoodRx.  - Si necesita que su receta se enve electrnicamente a una farmacia diferente, informe a nuestra oficina a travs de MyChart de [place] o por telfono llamando al [phone] y presione la opcin 4.

## 2023-10-29 ENCOUNTER — Encounter: Payer: Self-pay | Admitting: Dermatology

## 2023-11-18 ENCOUNTER — Other Ambulatory Visit (HOSPITAL_COMMUNITY)
Admission: RE | Admit: 2023-11-18 | Discharge: 2023-11-18 | Disposition: A | Discharge status: Home / Self Care | Source: Ambulatory Visit | Attending: Adult Health | Admitting: Adult Health

## 2023-11-18 ENCOUNTER — Encounter: Payer: Self-pay | Admitting: Adult Health

## 2023-11-18 ENCOUNTER — Ambulatory Visit: Admitting: Adult Health

## 2023-11-18 VITALS — BP 145/96 | HR 89 | Ht 62.0 in | Wt 192.5 lb

## 2023-11-18 DIAGNOSIS — Z01419 Encounter for gynecological examination (general) (routine) without abnormal findings: Secondary | ICD-10-CM

## 2023-11-18 DIAGNOSIS — Z1211 Encounter for screening for malignant neoplasm of colon: Secondary | ICD-10-CM

## 2023-11-18 DIAGNOSIS — Z1212 Encounter for screening for malignant neoplasm of rectum: Secondary | ICD-10-CM | POA: Diagnosis not present

## 2023-11-18 DIAGNOSIS — D649 Anemia, unspecified: Secondary | ICD-10-CM | POA: Diagnosis not present

## 2023-11-18 DIAGNOSIS — Z1231 Encounter for screening mammogram for malignant neoplasm of breast: Secondary | ICD-10-CM

## 2023-11-18 DIAGNOSIS — N921 Excessive and frequent menstruation with irregular cycle: Secondary | ICD-10-CM

## 2023-11-18 DIAGNOSIS — R030 Elevated blood-pressure reading, without diagnosis of hypertension: Secondary | ICD-10-CM

## 2023-11-18 LAB — HEMOCCULT GUIAC POC 1CARD (OFFICE): Fecal Occult Blood, POC: NEGATIVE

## 2023-11-18 LAB — POCT HEMOGLOBIN: Hemoglobin: 9.7 g/dL — AB (ref 11–14.6)

## 2023-11-18 MED ORDER — IRON 325 MG PO TABS: 1.0000 | ORAL_TABLET | Freq: Every day | ORAL | Status: AC

## 2023-11-18 NOTE — Progress Notes (Signed)
 Patient ID: Julie Butler, female   DOB: 08-18-1975, 48 y.o.   MRN: [id number] History of Present Illness: Julie Butler is a 48 year old Hispanic female, married, G4P4 in for a well woman gyn exam and pap. She had skipped about 7 months of periods after taking megace  to stop bleeding and then had heavy periods in July and August with clots. Changed pads every 30 minutes, and she is craving ice. Has had headache for 2 days.  She has an interpreter with her today.   PCP is Dr Tobie and sees I Polanco NP   Current Medications, Allergies, Past Medical History, Past Surgical History, Family History and Social History were reviewed in [employer] record.     Review of Systems: Patient denies any hearing loss, fatigue, blurred vision, shortness of breath, dizziness, chest pain, abdominal pain, problems with bowel movements, urination, or intercourse. No joint pain or mood swings.  See HPI for positives    Physical Exam:BP (!) 145/96 (BP Location: Right Arm, Patient Position: Sitting, Cuff Size: Normal)   Pulse 89   Ht 5' 2 (1.575 m)   Wt 192 lb 8 oz (87.3 kg)   LMP 10/30/2023 (Approximate)   BMI 35.21 kg/m  POC HGB was 9.7 General:  Well developed, well nourished, no acute distress Skin:  Warm and dry Neck:  Midline trachea, normal thyroid, good ROM, no lymphadenopathy Lungs; Clear to auscultation bilaterally Breast:  No dominant palpable mass, retraction, or nipple discharge Cardiovascular: Regular rate and rhythm Abdomen:  Soft, non tender, no hepatosplenomegaly Pelvic:  External genitalia is normal in appearance, no lesions.  The vagina is normal in appearance. Urethra has no lesions or masses. The cervix is bulbous, pap with HR HPV genotyping performed.  Uterus is felt to be normal size, shape, and contour.  No adnexal masses or tenderness noted.Bladder is non tender, no masses felt. Rectal: Good sphincter tone, no polyps, or hemorrhoids felt.  Hemoccult  negative. Extremities/musculoskeletal:  No swelling or varicosities noted, no clubbing or cyanosis Psych:  No mood changes, alert and cooperative,seems happy AA is 0 Fall risk is low    11/18/2023    2:42 PM 05/30/2022    3:36 PM 12/18/2021    3:56 PM  Depression screen PHQ 2/9  Decreased Interest 1 0 0  Down, Depressed, Hopeless 0 0 0  PHQ - 2 Score 1 0 0  Altered sleeping 0 0   Tired, decreased energy 1 0   Change in appetite 0 0   Feeling bad or failure about yourself  0 0   Trouble concentrating 0 0   Moving slowly or fidgety/restless 0 0   Suicidal thoughts 0 0   PHQ-9 Score 2 0   Difficult doing work/chores  Not difficult at all        11/18/2023    2:43 PM 05/30/2022    3:36 PM 12/01/2020   11:50 AM  GAD 7 : Generalized Anxiety Score  Nervous, Anxious, on Edge 0 0 1  Control/stop worrying 0 0 0  Worry too much - different things 1 0 0  Trouble relaxing 1 0 2  Restless 0 0 0  Easily annoyed or irritable 1 0 1  Afraid - awful might happen 1 0 1  Total GAD 7 Score 4 0 5  Anxiety Difficulty  Not difficult at all       Upstream - 11/18/23 1455       Pregnancy Intention Screening   Does the patient  want to become pregnant in the next year? No    Does the patient's partner want to become pregnant in the next year? No    Would the patient like to discuss contraceptive options today? No      Contraception Wrap Up   Current Method Female Sterilization    End Method Female Sterilization    Contraception Counseling Provided No          Examination chaperoned by Clarita Salt LPN    Impression and plan: 1. Encounter for gynecological examination with Papanicolaou smear of cervix (Primary) Pap sent Pap in 3-5 years if negative Physical in 1 year  Labs with PCP   - Cytology - PAP( [place])  2. Anemia, unspecified type Take OTC iron   Eat iron  rich foods  - POCT hemoglobin Follow up in 1 month for recheck HGB and ROS   3. Menorrhagia with irregular  cycle Skipped periods for about 7 months after taking megace  to stop bleeding, then periods heavy July and August with clots, changed pads every 30 minutes  Will get pelvic US  11/22/23 at 11:30 am [facility] to assess uterus  - US  PELVIC COMPLETE WITH TRANSVAGINAL; Future  4. Encounter for screening fecal occult blood testing Hemoccult was negative  - POCT occult blood stool  5. Screening mammogram for breast cancer Mammogram scheduled for her 11/25/23 at 11 am at [facility] - MM 3D SCREENING MAMMOGRAM BILATERAL BREAST; Future  6. Screening for colorectal cancer Cologuard ordered this will be her second one  - Cologuard  7. Elevated BP without diagnosis of hypertension Keep check on BP and follow up with PCP Decrease corn

## 2023-11-21 LAB — CYTOLOGY - PAP
Comment: NEGATIVE
Diagnosis: UNDETERMINED — AB
High risk HPV: NEGATIVE

## 2023-11-22 ENCOUNTER — Ambulatory Visit (HOSPITAL_COMMUNITY)

## 2023-11-25 ENCOUNTER — Encounter (HOSPITAL_COMMUNITY): Payer: Self-pay

## 2023-11-25 ENCOUNTER — Ambulatory Visit (HOSPITAL_COMMUNITY)
Admission: RE | Admit: 2023-11-25 | Discharge: 2023-11-25 | Disposition: A | Discharge status: Home / Self Care | Source: Ambulatory Visit | Attending: Adult Health | Admitting: Adult Health

## 2023-11-25 ENCOUNTER — Ambulatory Visit: Payer: Self-pay | Admitting: Adult Health

## 2023-11-25 DIAGNOSIS — Z1231 Encounter for screening mammogram for malignant neoplasm of breast: Secondary | ICD-10-CM | POA: Insufficient documentation

## 2023-11-25 DIAGNOSIS — R87610 Atypical squamous cells of undetermined significance on cytologic smear of cervix (ASC-US): Secondary | ICD-10-CM | POA: Insufficient documentation

## 2023-12-18 ENCOUNTER — Ambulatory Visit: Admitting: Adult Health

## 2023-12-18 ENCOUNTER — Encounter: Payer: Self-pay | Admitting: Adult Health

## 2023-12-18 VITALS — BP 138/94 | HR 70 | Ht 62.0 in | Wt 193.0 lb

## 2023-12-18 DIAGNOSIS — Z862 Personal history of diseases of the blood and blood-forming organs and certain disorders involving the immune mechanism: Secondary | ICD-10-CM | POA: Diagnosis not present

## 2023-12-18 DIAGNOSIS — N921 Excessive and frequent menstruation with irregular cycle: Secondary | ICD-10-CM

## 2023-12-18 DIAGNOSIS — R232 Flushing: Secondary | ICD-10-CM

## 2023-12-18 DIAGNOSIS — R030 Elevated blood-pressure reading, without diagnosis of hypertension: Secondary | ICD-10-CM

## 2023-12-18 LAB — POCT HEMOGLOBIN: Hemoglobin: 11.8 g/dL (ref 11–14.6)

## 2023-12-18 MED ORDER — MEGESTROL ACETATE 40 MG PO TABS: ORAL_TABLET | ORAL | 1 refills | Status: AC

## 2023-12-18 NOTE — Progress Notes (Signed)
  Subjective:     Patient ID: Julie Butler, female   DOB: [birth year], 48 y.o.   MRN: [id number]  HPI Julie Butler is a 48 year old Hispanic female, married, G4P4, back in follow up on bleeding and has not had a period since August. She is taking iron  and eating beets. She said US  at [facility] got cancelled, not sure how, but will get one rescheduled in office. She has interpreter with her.     Component Value Date/Time   DIAGPAP (A) 11/18/2023 1459    - Atypical squamous cells of undetermined significance (ASC-US )   DIAGPAP  12/01/2020 1359    - Negative for intraepithelial lesion or malignancy (NILM)   DIAGPAP  11/11/2017 0000    NEGATIVE FOR INTRAEPITHELIAL LESIONS OR MALIGNANCY.   HPVHIGH Negative 11/18/2023 1459   HPVHIGH Negative 12/01/2020 1359   ADEQPAP  11/18/2023 1459    Satisfactory for evaluation; transformation zone component PRESENT.   ADEQPAP  12/01/2020 1359    Satisfactory for evaluation; transformation zone component PRESENT.   ADEQPAP  11/11/2017 0000    Satisfactory for evaluation  endocervical/transformation zone component PRESENT.    PCP is Dr Tobie. Review of Systems No period since August Still tired but may be due arthritis Has some SHOB at times Has more facial hair on chin +hot flashes Reviewed past medical,surgical, social and family history. Reviewed medications and allergies.     Objective:   Physical Exam BP (!) 138/94 (BP Location: Left Arm, Patient Position: Sitting, Cuff Size: Large)   Pulse 70   Ht 5' 2 (1.575 m)   Wt 193 lb (87.5 kg)   LMP 10/30/2023 (Approximate)   BMI 35.30 kg/m  HGB is better at 11.8 Skin warm and dry. Lungs: clear to ausculation bilaterally. Cardiovascular: regular rate and rhythm.      Upstream - 12/18/23 0925       Pregnancy Intention Screening   Does the patient want to become pregnant in the next year? No    Does the patient's partner want to become pregnant in the next year? No    Would the patient like to  discuss contraceptive options today? No      Contraception Wrap Up   Current Method Female Sterilization    End Method Female Sterilization    Contraception Counseling Provided No          Assessment:     1. History of anemia Continue iron  and eating beets - POCT hemoglobin  2. Menorrhagia with irregular cycle (Primary) No period since August, but she wants megace  refilled just in case it comes on and gets heavy Will get US  in office in 2-3 weeks to assess uterus  - US  PELVIC COMPLETE WITH TRANSVAGINAL; Future Meds ordered this encounter  Medications   megestrol  (MEGACE ) 40 MG tablet    Sig: Take 3 x 5 days then 2 x 5 days then 1 daily till bleeding stops    Dispense:  45 tablet    Refill:  1    Can you do in spanish    Supervising Provider:   JAYNE MINDER H [[id number]]    3. Elevated BP without diagnosis of hypertension   4. Hot flashes     Plan:     Return in 2-3 weeks for US  and see me for review

## 2024-01-06 ENCOUNTER — Ambulatory Visit: Admitting: Dermatology

## 2024-01-06 DIAGNOSIS — L820 Inflamed seborrheic keratosis: Secondary | ICD-10-CM | POA: Diagnosis not present

## 2024-01-06 DIAGNOSIS — L821 Other seborrheic keratosis: Secondary | ICD-10-CM | POA: Diagnosis not present

## 2024-01-06 DIAGNOSIS — Z7189 Other specified counseling: Secondary | ICD-10-CM

## 2024-01-06 DIAGNOSIS — Z79899 Other long term (current) drug therapy: Secondary | ICD-10-CM

## 2024-01-06 NOTE — Patient Instructions (Addendum)
 Apply diclofenac (voltaren) gel twice a day to spots.        Due to recent changes in healthcare laws, you may see results of your pathology and/or laboratory studies on MyChart before the doctors have had a chance to review them. We understand that in some cases there may be results that are confusing or concerning to you. Please understand that not all results are received at the same time and often the doctors may need to interpret multiple results in order to provide you with the best plan of care or course of treatment. Therefore, we ask that you please give us  2 business days to thoroughly review all your results before contacting the office for clarification. Should we see a critical lab result, you will be contacted sooner.   If You Need Anything After Your Visit  If you have any questions or concerns for your doctor, please call our main line at [phone] and press option 4 to reach your doctor's medical assistant. If no one answers, please leave a voicemail as directed and we will return your call as soon as possible. Messages left after 4 pm will be answered the following business day.   You may also send us  a message via MyChart. We typically respond to MyChart messages within 1-2 business days.  For prescription refills, please ask your pharmacy to contact our office. Our fax number is [phone].  If you have an urgent issue when the clinic is closed that cannot wait until the next business day, you can page your doctor at the number below.    Please note that while we do our best to be available for urgent issues outside of office hours, we are not available 24/7.   If you have an urgent issue and are unable to reach us , you may choose to seek medical care at your doctor's office, retail clinic, urgent care center, or emergency room.  If you have a medical emergency, please immediately call 911 or go to the emergency department.  Pager Numbers  - Dr. Hester:  [phone]  - Dr. Jackquline: [phone]  - Dr. Claudene: [phone]   - Dr. Raymund: [phone]  In the event of inclement weather, please call our main line at [phone] for an update on the status of any delays or closures.  Dermatology Medication Tips: Please keep the boxes that topical medications come in in order to help keep track of the instructions about where and how to use these. Pharmacies typically print the medication instructions only on the boxes and not directly on the medication tubes.   If your medication is too expensive, please contact our office at [phone] option 4 or send us  a message through MyChart.   We are unable to tell what your co-pay for medications will be in advance as this is different depending on your insurance coverage. However, we may be able to find a substitute medication at lower cost or fill out paperwork to get insurance to cover a needed medication.   If a prior authorization is required to get your medication covered by your insurance company, please allow us  1-2 business days to complete this process.  Drug prices often vary depending on where the prescription is filled and some pharmacies may offer cheaper prices.  The website www.goodrx.com contains coupons for medications through different pharmacies. The prices here do not account for what the cost may be with help from insurance (it may be cheaper with your insurance), but the website can give  you the price if you did not use any insurance.  - You can print the associated coupon and take it with your prescription to the pharmacy.  - You may also stop by our office during regular business hours and pick up a GoodRx coupon card.  - If you need your prescription sent electronically to a different pharmacy, notify our office through [facility] or by phone at [phone] option 4.     Si Usted Necesita Algo Despus de Su Visita  Tambin puede enviarnos un mensaje a travs  de Clinical Cytogeneticist. Por lo general respondemos a los mensajes de MyChart en el transcurso de 1 a 2 das hbiles.  Para renovar recetas, por favor pida a su farmacia que se ponga en contacto con nuestra oficina. Randi lakes de fax es [place] [phone].  Si tiene un asunto urgente cuando la clnica est cerrada y que no puede esperar hasta el siguiente da hbil, puede llamar/localizar a su doctor(a) al nmero que aparece a continuacin.   Por favor, tenga en cuenta que aunque hacemos todo lo posible para estar disponibles para asuntos urgentes fuera del horario de [place], no estamos disponibles las 24 horas del da, los 7 [address]  [address] de la [place].   Si tiene un problema urgente y no puede comunicarse con nosotros, puede optar por buscar atencin mdica  en el consultorio de su doctor(a), en una clnica privada, en un centro de atencin urgente o en una sala de emergencias.  Si tiene engineer, drilling, por favor llame inmediatamente al 911 o vaya a la sala de emergencias.  Nmeros de bper  - Dr. Hester: [phone]  - Dra. Jackquline: [phone]  - Dr. Claudene: [phone]  - Dra. Kitts: [phone]  En caso de inclemencias del [place], por favor llame a nuestra lnea principal al [phone] para una actualizacin sobre el estado de cualquier retraso o cierre.  Consejos para la medicacin en dermatologa: Por favor, guarde las cajas en las que vienen los medicamentos de uso tpico para ayudarle a seguir las instrucciones sobre dnde y cmo usarlos. Las farmacias generalmente imprimen las instrucciones del medicamento slo en las cajas y no directamente en los tubos del [place].   Si su medicamento es muy caro, por favor, pngase en contacto con landry rieger llamando al [phone] y presione la opcin 4 o envenos un mensaje a travs de Clinical Cytogeneticist.   No podemos decirle cul ser su copago por los medicamentos por adelantado ya que esto es diferente dependiendo de la cobertura de su seguro.  Sin embargo, es posible que podamos encontrar un medicamento sustituto a audiological scientist un formulario para que el seguro cubra el medicamento que se considera necesario.   Si se requiere una autorizacin previa para que su compaa de seguros cubra su medicamento, por favor permtanos de 1 a 2 das hbiles para completar este proceso.  Los precios de los medicamentos varan con frecuencia dependiendo del environmental consultant de dnde se surte la receta y alguna farmacias pueden ofrecer precios ms baratos.  El sitio web www.goodrx.com tiene cupones para medicamentos de health and safety inspector. Los precios aqu no tienen en cuenta lo que podra costar con la ayuda del seguro (puede ser ms barato con su seguro), pero el sitio web puede darle el precio si no utiliz tourist information centre manager.  - Puede imprimir el cupn correspondiente y llevarlo con su receta a la farmacia.  - Tambin puede pasar por nuestra oficina durante el horario de atencin regular y education officer, museum una tarjeta de cupones  de GoodRx.  - Si necesita que su receta se enve electrnicamente a una farmacia diferente, informe a nuestra oficina a travs de MyChart de  o por telfono llamando al [phone] y presione la opcin 4.

## 2024-01-06 NOTE — Progress Notes (Unsigned)
   Follow-Up Visit   Subjective  Julie Butler is a 48 y.o. female who presents for the following: 3 months f/u on ISK on her face and neck treated with LN2 with a good response.    Daughter/Translator is with patient and contributes to history.   The following portions of the chart were reviewed this encounter and updated as appropriate: medications, allergies, medical history  Review of Systems:  No other skin or systemic complaints except as noted in HPI or Assessment and Plan.  Objective  Well appearing patient in no apparent distress; mood and affect are within normal limits.  A focused examination was performed of the following areas:face,neck   Relevant exam findings are noted in the Assessment and Plan.  face x 21, left neck x 7, right neck x 7 (35) Stuck-on, waxy, tan-brown papules and plaques -- Discussed benign etiology and prognosis.    Assessment & Plan   SEBORRHEIC KERATOSIS - Stuck-on, waxy, tan-brown papules and/or plaques  - Benign-appearing - Discussed benign etiology and prognosis. - Observe - Call for any changes Recommend start otc diclofenac (voltaren) gel twice a day to spots.     INFLAMED SEBORRHEIC KERATOSIS (35) face x 21, left neck x 7, right neck x 7 (35) Symptomatic, irritating, patient would like treated.  Discussed with patient after treating with LN2 she can grow new spots.   Photo taken of the spot treated on the face today, we will recheck at next office visit  Destruction of lesion - face x 21, left neck x 7, right neck x 7 (35) Complexity: simple   Destruction method: cryotherapy   Informed consent: discussed and consent obtained   Timeout:  patient name, date of birth, surgical site, and procedure verified Lesion destroyed using liquid nitrogen: Yes   Region frozen until ice ball extended beyond lesion: Yes   Outcome: patient tolerated procedure well with no complications   Post-procedure details: wound care instructions  given   Additional details:  Prior to procedure, discussed risks of blister formation, small wound, skin dyspigmentation, or rare scar following cryotherapy. Recommend Vaseline ointment to treated areas while healing.    Return in 3 months (on 04/07/2024) for ISKs.  IFay Kirks, CMA, am acting as scribe for Alm Rhyme, MD .   Documentation: I have reviewed the above documentation for accuracy and completeness, and I agree with the above.  Alm Rhyme, MD

## 2024-01-07 ENCOUNTER — Encounter: Payer: Self-pay | Admitting: Adult Health

## 2024-01-07 ENCOUNTER — Ambulatory Visit

## 2024-01-07 ENCOUNTER — Ambulatory Visit (INDEPENDENT_AMBULATORY_CARE_PROVIDER_SITE_OTHER): Admitting: Adult Health

## 2024-01-07 VITALS — BP 153/91 | HR 76 | Ht 62.0 in | Wt 197.0 lb

## 2024-01-07 DIAGNOSIS — D252 Subserosal leiomyoma of uterus: Secondary | ICD-10-CM

## 2024-01-07 DIAGNOSIS — N83291 Other ovarian cyst, right side: Secondary | ICD-10-CM

## 2024-01-07 DIAGNOSIS — N921 Excessive and frequent menstruation with irregular cycle: Secondary | ICD-10-CM | POA: Diagnosis not present

## 2024-01-07 DIAGNOSIS — Z862 Personal history of diseases of the blood and blood-forming organs and certain disorders involving the immune mechanism: Secondary | ICD-10-CM | POA: Diagnosis not present

## 2024-01-07 DIAGNOSIS — N840 Polyp of corpus uteri: Secondary | ICD-10-CM

## 2024-01-07 DIAGNOSIS — R030 Elevated blood-pressure reading, without diagnosis of hypertension: Secondary | ICD-10-CM | POA: Diagnosis not present

## 2024-01-07 DIAGNOSIS — D219 Benign neoplasm of connective and other soft tissue, unspecified: Secondary | ICD-10-CM

## 2024-01-07 DIAGNOSIS — D251 Intramural leiomyoma of uterus: Secondary | ICD-10-CM | POA: Diagnosis not present

## 2024-01-07 NOTE — Progress Notes (Signed)
  Subjective:     Patient ID: Julie Butler, female   DOB: 1976/02/17, 48 y.o.   MRN: [id number]  HPI Julie Butler is a 48 year old Hispanic female, married, G4P4, in for pelvic US  for menorrhagia. Has interpreter [place] with her.     Component Value Date/Time   DIAGPAP (A) 11/18/2023 1459    - Atypical squamous cells of undetermined significance (ASC-US )   DIAGPAP  12/01/2020 1359    - Negative for intraepithelial lesion or malignancy (NILM)   DIAGPAP  11/11/2017 0000    NEGATIVE FOR INTRAEPITHELIAL LESIONS OR MALIGNANCY.   HPVHIGH Negative 11/18/2023 1459   HPVHIGH Negative 12/01/2020 1359   ADEQPAP  11/18/2023 1459    Satisfactory for evaluation; transformation zone component PRESENT.   ADEQPAP  12/01/2020 1359    Satisfactory for evaluation; transformation zone component PRESENT.   ADEQPAP  11/11/2017 0000    Satisfactory for evaluation  endocervical/transformation zone component PRESENT.    PCP is Dr Tobie Review of Systems Megace  stops heavy bleeding Reviewed past medical,surgical, social and family history. Reviewed medications and allergies.     Objective:   Physical Exam BP (!) 153/91 (BP Location: Right Arm, Patient Position: Sitting, Cuff Size: Large)   Pulse 76   Ht 5' 2 (1.575 m)   Wt 197 lb (89.4 kg)   BMI 36.03 kg/m     Skin warm and dry. Lungs: clear to ausculation bilaterally. Cardiovascular: regular rate and rhythm.  Reviewed US : uterus is enlarged with multiple small fibroids and has echogenic endometrial mass 10.5 x 7 x 8 mm and endometrium is thickened at 32 mm, left ovary is normal amd has simple cyst right ovary 2.7 x 2.4 x 2.1 cm. Fall risk is low  Upstream - 01/07/24 1053       Pregnancy Intention Screening   Does the patient want to become pregnant in the next year? No    Does the patient's partner want to become pregnant in the next year? No    Would the patient like to discuss contraceptive options today? No      Contraception Wrap Up    Current Method Female Sterilization    End Method Female Sterilization    Contraception Counseling Provided No          Assessment:     1. Endometrial polyp (Primary) Has echogenic endometrial mass 10.5 x 7 x 8 mm and endometrium is thickened at 32 mm Will get SHG to evaluate further with Dr Ozan  - US  [facility]; Future  2. Menorrhagia with irregular cycle Megace  will stop bleeding if heavy, has refills  - US  [facility]; Future  3. History of anemia  4. Elevated BP without diagnosis of hypertension Follow up with PCP    5. Fibroids   Plan:     Return for [facility] with Dr Ozan, appt made for 01/28/24

## 2024-01-07 NOTE — Progress Notes (Signed)
 PELVIC US  TA/TV: enlarged heterogeneous anteverted uterus,multiple small fibroids,(#1) posterior subserosal fibroid 2.2 x 1.1 x 1.9 cm,(#2) mid left intramural fibroid 1.5 x 1.4 x 1 cm,hypervascular thickened endometrium with a 10.5 x 7 x 8 mm echogenic vascular endometrial mass,EEC 32 mm,simple unilocular right ovarian cyst 2.7 x 2.1 x 2.4 cm,normal left ovary,ovaries appear mobile,no free fluid,multiple simple nabothian cysts  Chaperone [place] (interpreter)

## 2024-01-08 ENCOUNTER — Encounter: Payer: Self-pay | Admitting: Dermatology

## 2024-01-08 ENCOUNTER — Ambulatory Visit: Payer: Self-pay | Admitting: Adult Health

## 2024-01-27 ENCOUNTER — Other Ambulatory Visit: Payer: Self-pay | Admitting: Adult Health

## 2024-01-27 DIAGNOSIS — N840 Polyp of corpus uteri: Secondary | ICD-10-CM

## 2024-01-27 DIAGNOSIS — N921 Excessive and frequent menstruation with irregular cycle: Secondary | ICD-10-CM

## 2024-01-28 ENCOUNTER — Ambulatory Visit

## 2024-01-28 ENCOUNTER — Ambulatory Visit: Admitting: Obstetrics & Gynecology

## 2024-01-28 ENCOUNTER — Encounter: Payer: Self-pay | Admitting: Obstetrics & Gynecology

## 2024-01-28 ENCOUNTER — Other Ambulatory Visit (HOSPITAL_COMMUNITY)
Admission: RE | Admit: 2024-01-28 | Discharge: 2024-01-28 | Disposition: A | Discharge status: Home / Self Care | Source: Ambulatory Visit | Attending: Obstetrics & Gynecology | Admitting: Obstetrics & Gynecology

## 2024-01-28 VITALS — BP 153/92 | HR 69 | Ht 62.0 in | Wt 197.0 lb

## 2024-01-28 DIAGNOSIS — D250 Submucous leiomyoma of uterus: Secondary | ICD-10-CM

## 2024-01-28 DIAGNOSIS — N921 Excessive and frequent menstruation with irregular cycle: Secondary | ICD-10-CM

## 2024-01-28 DIAGNOSIS — D251 Intramural leiomyoma of uterus: Secondary | ICD-10-CM

## 2024-01-28 DIAGNOSIS — N939 Abnormal uterine and vaginal bleeding, unspecified: Secondary | ICD-10-CM

## 2024-01-28 DIAGNOSIS — N83291 Other ovarian cyst, right side: Secondary | ICD-10-CM

## 2024-01-28 DIAGNOSIS — N840 Polyp of corpus uteri: Secondary | ICD-10-CM

## 2024-01-28 DIAGNOSIS — R9389 Abnormal findings on diagnostic imaging of other specified body structures: Secondary | ICD-10-CM

## 2024-01-28 NOTE — Progress Notes (Signed)
 GYN VISIT Patient name: Julie Butler MRN [id number]  Date of birth: 10/07/1975 Chief Complaint:   Follow-up (HSG today)  History of Present Illness:   Julie Butler is a 48 y.o. G43P4 female being seen today for SHG/follow up regarding:  AUB: Pt of [facility]- notes h/o HMB in July and August- took megace  which stopped her bleeding.  Bleeding  Took megace  for only one day.  Only takes the Megace  when needed for heavy bleeding.  Prior to this summer, it had been several months without a period.  Denies HMB for the past several months.  Denies pelvic or abdominal pain.  Reports no acute GYN concerns  No LMP recorded. (Menstrual status: Irregular Periods).    Review of Systems:   Pertinent items are noted in HPI Denies fever/chills, dizziness, headaches, visual disturbances, fatigue, shortness of breath, chest pain, abdominal pain, vomiting,  Pertinent History Reviewed:   Past Surgical History:  Procedure Laterality Date   TUBAL LIGATION      Past Medical History:  Diagnosis Date   Breast nodule 12/08/2014   Breast pain, left 12/08/2014   Elevated BP 12/08/2014   Trichimoniasis 12/16/2014   Trichimoniasis 12/16/2014   Reviewed problem list, medications and allergies. Physical Assessment:   Vitals:   01/28/24 1130 01/28/24 1141  BP: (!) 168/98 (!) 153/92  Pulse: 80 69  Weight: 197 lb (89.4 kg)   Height: 5' 2 (1.575 m)   Body mass index is 36.03 kg/m.       Physical Examination:   General appearance: alert, well appearing, and in no distress  Psych: mood appropriate, normal affect  Skin: warm & dry   Cardiovascular: normal heart rate noted  Respiratory: normal respiratory effort, no distress  Abdomen: soft, non-tender   Pelvic: completed as part of SHG/EMB  Extremities: no edema   Endometrial Biopsy Procedure Note  Pre-operative Diagnosis: Abnormal uterine bleeding  Post-operative Diagnosis: same  Procedure Details   The risks (including  infection, bleeding, pain, and uterine perforation) and benefits of the procedure were explained to the patient and Written informed consent was obtained.  Antibiotic prophylaxis against endocarditis was not indicated.   The patient was placed in the dorsal lithotomy position.  Bimanual exam showed the uterus to be in the neutral position.  A speculum inserted in the vagina, and the cervix prepped with betadine.     A single tooth tenaculum was applied to the anterior lip of the cervix for stabilization.  Os finder was used.  A Pipelle endometrial aspirator was used to sample the endometrium.  Sample was sent for pathologic examination.  Condition: Stable  Complications: None  Chaperone: Nurse, Learning Disability & Plan:  1) AUB -reviewed US  findings- no polyp appreciated; however lining slightly thickened - Informed consent obtained for EMB, see above procedure -Next step pending results of pathology The patient was advised to call for any fever or for prolonged or severe pain or bleeding. She was advised to use OTC analgesics as needed for mild to moderate pain. She was advised to avoid vaginal intercourse for 48 hours or until the bleeding has completely stopped. - Okay to continue with Megace  as needed  Follow-up for annual in September 2026 or sooner with any acute concerns   No orders of the defined types were placed in this encounter.   Return for Sept 2026 annual.   Taylinn Brabant, DO Attending Obstetrician & Gynecologist, [facility] for [facility], [facility]

## 2024-01-28 NOTE — Progress Notes (Signed)
 PELVIC SONOHYSEROGRAM: heterogeneous uterus with multiple small fibroids,(#1) posterior submucosal fibroid 2.2 x .9 x 1.9 cm,(#2) left intramural fibroid 1.5 x 1.6 x 1 cm,thickened endometrium 12.7 mm,multiple nabothian cysts, normal left ovary,simple right ovarian cyst  Sonohysterogram was performed by Dr. Marilynn patee ultrasound guidance and surveillance throughout the procedure. Saline outlined the smooth symmetrical wall,no solitary mass noted within the endometrium.

## 2024-01-29 LAB — SURGICAL PATHOLOGY

## 2024-02-03 ENCOUNTER — Ambulatory Visit: Payer: Self-pay | Admitting: Obstetrics & Gynecology

## 2024-02-18 ENCOUNTER — Encounter (HOSPITAL_COMMUNITY): Payer: Self-pay | Admitting: Emergency Medicine

## 2024-02-18 ENCOUNTER — Ambulatory Visit: Payer: Self-pay

## 2024-02-18 ENCOUNTER — Emergency Department (HOSPITAL_COMMUNITY)

## 2024-02-18 ENCOUNTER — Other Ambulatory Visit: Payer: Self-pay

## 2024-02-18 ENCOUNTER — Emergency Department (HOSPITAL_COMMUNITY)
Admission: EM | Admit: 2024-02-18 | Discharge: 2024-02-18 | Disposition: A | Discharge status: Home / Self Care | Attending: Emergency Medicine | Admitting: Emergency Medicine

## 2024-02-18 DIAGNOSIS — J101 Influenza due to other identified influenza virus with other respiratory manifestations: Secondary | ICD-10-CM

## 2024-02-18 DIAGNOSIS — R0602 Shortness of breath: Secondary | ICD-10-CM | POA: Diagnosis present

## 2024-02-18 DIAGNOSIS — R059 Cough, unspecified: Secondary | ICD-10-CM | POA: Diagnosis not present

## 2024-02-18 LAB — URINALYSIS, ROUTINE W REFLEX MICROSCOPIC
Bilirubin Urine: NEGATIVE
Glucose, UA: NEGATIVE mg/dL
Hgb urine dipstick: NEGATIVE
Ketones, ur: NEGATIVE mg/dL
Leukocytes,Ua: NEGATIVE
Nitrite: NEGATIVE
Protein, ur: NEGATIVE mg/dL
Specific Gravity, Urine: 1.01 (ref 1.005–1.030)
pH: 6 (ref 5.0–8.0)

## 2024-02-18 LAB — CBC WITH DIFFERENTIAL/PLATELET
Abs Immature Granulocytes: 0.01 K/uL (ref 0.00–0.07)
Basophils Absolute: 0 K/uL (ref 0.0–0.1)
Basophils Relative: 1 %
Eosinophils Absolute: 0.3 K/uL (ref 0.0–0.5)
Eosinophils Relative: 8 %
HCT: 40.9 % (ref 36.0–46.0)
Hemoglobin: 12.7 g/dL (ref 12.0–15.0)
Immature Granulocytes: 0 %
Lymphocytes Relative: 27 %
Lymphs Abs: 1.1 K/uL (ref 0.7–4.0)
MCH: 22.4 pg — ABNORMAL LOW (ref 26.0–34.0)
MCHC: 31.1 g/dL (ref 30.0–36.0)
MCV: 72.3 fL — ABNORMAL LOW (ref 80.0–100.0)
Monocytes Absolute: 0.4 K/uL (ref 0.1–1.0)
Monocytes Relative: 10 %
Neutro Abs: 2.3 K/uL (ref 1.7–7.7)
Neutrophils Relative %: 54 %
Platelets: 214 K/uL (ref 150–400)
RBC: 5.66 MIL/uL — ABNORMAL HIGH (ref 3.87–5.11)
RDW: 20 % — ABNORMAL HIGH (ref 11.5–15.5)
WBC: 4.2 K/uL (ref 4.0–10.5)
nRBC: 0 % (ref 0.0–0.2)

## 2024-02-18 LAB — POC URINE PREG, ED: Preg Test, Ur: NEGATIVE

## 2024-02-18 LAB — BASIC METABOLIC PANEL WITH GFR
Anion gap: 13 (ref 5–15)
BUN: 6 mg/dL (ref 6–20)
CO2: 20 mmol/L — ABNORMAL LOW (ref 22–32)
Calcium: 8.4 mg/dL — ABNORMAL LOW (ref 8.9–10.3)
Chloride: 107 mmol/L (ref 98–111)
Creatinine, Ser: 0.48 mg/dL (ref 0.44–1.00)
GFR, Estimated: >60 mL/min (ref >60)
Glucose, Bld: 99 mg/dL (ref 70–99)
Potassium: 3.6 mmol/L (ref 3.5–5.1)
Sodium: 140 mmol/L (ref 135–145)

## 2024-02-18 LAB — RESP PANEL BY RT-PCR (RSV, FLU A&B, COVID)  RVPGX2
Influenza A by PCR: POSITIVE — AB
Influenza B by PCR: NEGATIVE
Resp Syncytial Virus by PCR: NEGATIVE
SARS Coronavirus 2 by RT PCR: NEGATIVE

## 2024-02-18 LAB — HEPATIC FUNCTION PANEL
ALT: 34 U/L (ref 0–44)
AST: 37 U/L (ref 15–41)
Albumin: 4.2 g/dL (ref 3.5–5.0)
Alkaline Phosphatase: 104 U/L (ref 38–126)
Bilirubin, Direct: 0.1 mg/dL (ref 0.0–0.2)
Indirect Bilirubin: 0.2 mg/dL — ABNORMAL LOW (ref 0.3–0.9)
Total Bilirubin: 0.4 mg/dL (ref 0.0–1.2)
Total Protein: 7.5 g/dL (ref 6.5–8.1)

## 2024-02-18 LAB — PRO BRAIN NATRIURETIC PEPTIDE: Pro Brain Natriuretic Peptide: <50 pg/mL (ref <300.0)

## 2024-02-18 LAB — TROPONIN T, HIGH SENSITIVITY: Troponin T High Sensitivity: <15 ng/L (ref 0–19)

## 2024-02-18 LAB — HCG, SERUM, QUALITATIVE: Preg, Serum: NEGATIVE

## 2024-02-18 MED ORDER — OXYCODONE-ACETAMINOPHEN 5-325 MG PO TABS
1.0000 | ORAL_TABLET | Freq: Once | ORAL | Status: AC
  Administered 2024-02-18: 11:00:00 1 via ORAL
  Filled 2024-02-18: qty 1

## 2024-02-18 MED ORDER — METOCLOPRAMIDE HCL 5 MG/ML IJ SOLN
10.0000 mg | Freq: Once | INTRAMUSCULAR | Status: AC
  Administered 2024-02-18: 11:00:00 10 mg via INTRAVENOUS
  Filled 2024-02-18: qty 2

## 2024-02-18 MED ORDER — LACTATED RINGERS IV BOLUS
500.0000 mL | Freq: Once | INTRAVENOUS | Status: AC
  Administered 2024-02-18: 11:00:00 500 mL via INTRAVENOUS

## 2024-02-18 MED ORDER — IOHEXOL 350 MG/ML SOLN
75.0000 mL | Freq: Once | INTRAVENOUS | Status: AC | PRN
  Administered 2024-02-18: 13:00:00 75 mL via INTRAVENOUS

## 2024-02-18 MED ORDER — AMLODIPINE BESYLATE 5 MG PO TABS
5.0000 mg | ORAL_TABLET | Freq: Once | ORAL | Status: AC
  Administered 2024-02-18: 12:00:00 5 mg via ORAL
  Filled 2024-02-18: qty 1

## 2024-02-18 MED ORDER — DEXAMETHASONE SOD PHOSPHATE PF 10 MG/ML IJ SOLN
10.0000 mg | Freq: Once | INTRAMUSCULAR | Status: AC
  Administered 2024-02-18: 11:00:00 10 mg via INTRAVENOUS

## 2024-02-18 MED ORDER — ONDANSETRON 4 MG PO TBDP: 4.0000 mg | ORAL_TABLET | Freq: Three times a day (TID) | ORAL | 0 refills | Status: AC | PRN

## 2024-02-18 MED ORDER — DIPHENHYDRAMINE HCL 50 MG/ML IJ SOLN
12.5000 mg | Freq: Once | INTRAMUSCULAR | Status: AC
  Administered 2024-02-18: 11:00:00 12.5 mg via INTRAVENOUS
  Filled 2024-02-18: qty 1

## 2024-02-18 MED ORDER — AMLODIPINE BESYLATE 5 MG PO TABS: 5.0000 mg | ORAL_TABLET | Freq: Every day | ORAL | 2 refills | Status: AC

## 2024-02-18 MED ORDER — IOHEXOL 350 MG/ML SOLN: 75.0000 mL | Freq: Once | INTRAVENOUS | Status: DC | PRN

## 2024-02-18 MED ORDER — KETOROLAC TROMETHAMINE 15 MG/ML IJ SOLN
15.0000 mg | Freq: Once | INTRAMUSCULAR | Status: AC
  Administered 2024-02-18: 11:00:00 15 mg via INTRAVENOUS
  Filled 2024-02-18: qty 1

## 2024-02-18 NOTE — ED Triage Notes (Signed)
 Pt c/o of cold like symptoms x3 days. Pt states today she is SOB and having chest and upper back pain.

## 2024-02-18 NOTE — Telephone Encounter (Signed)
 FYI Only or Action Required?: FYI only for provider: ED advised.  Patient was last seen in primary care on 04/11/2023 by Terry Wilhelmena Lloyd Hilario, FNP.  Called Nurse Triage reporting Shortness of Breath.  Symptoms began several days ago.  Interventions attempted: Nothing.  Symptoms are: unchanged.  Triage Disposition: Go to ED Now (or PCP Triage)  Patient/caregiver understands and will follow disposition?: Yes Reason for Disposition  Patient sounds very sick or weak to the triager  Answer Assessment - Initial Assessment Questions Patient's daughter Analuisa calling with patient today. Advised ED, patient's daughter agreeable  1. RESPIRATORY STATUS: Describe your breathing? (e.g., wheezing, shortness of breath, unable to speak, severe coughing)      Feeling tiny stabbing on her chest and back, feeling like brain is not getting enough oxygen, mouth breathing, denies nasal congestion  2. ONSET: When did this breathing problem begin?      2 days ago  3. PATTERN Does the difficult breathing come and go, or has it been constant since it started?      Constant  4. RECURRENT SYMPTOM: Have you had difficulty breathing before? If Yes, ask: When was the last time? and What happened that time?      Denies  5. CARDIAC HISTORY: Do you have any history of heart disease? (e.g., heart attack, angina, bypass surgery, angioplasty)      Unsure  6. LUNG HISTORY: Do you have any history of lung disease?  (e.g., pulmonary embolus, asthma, emphysema)     Unsure  7. CAUSE: What do you think is causing the breathing problem?      Unsure  8. OTHER SYMPTOMS: Do you have any other symptoms? (e.g., chest pain, cough, dizziness, fever, runny nose)     Hemoptysis last night, high blood pressure as of late  9. O2 SATURATION MONITOR:  Do you use an oxygen saturation monitor (pulse oximeter) at home? If Yes, ask: What is your reading (oxygen level) today? What is your usual oxygen  saturation reading? (e.g., 95%)       Unable to assess  Protocols used: Breathing Difficulty-A-AH  Copied from CRM #[id number]. Topic: Clinical - Red Word Triage >> Feb 18, 2024  9:30 AM Kevelyn M wrote: Red Word that prompted transfer to Nurse Triage: Patient is coughing up blood and mucus and having difficulty breathing with congestion for the last two days.

## 2024-02-18 NOTE — ED Provider Notes (Signed)
 [place] EMERGENCY DEPARTMENT AT [facility] Provider Note   CSN: [id number] Arrival date & time: 02/18/24  1009     Patient presents with: Shortness of Breath   Julie Butler is a 48 y.o. female.    Shortness of Breath Associated symptoms: chest pain and cough   Patient presents for multiple complaints.  Medical history includes prediabetes, migraines, HLD, GERD.  3 days ago, she developed cough and shortness of breath.  Her breathing slightly improved yesterday but has gotten worse today.  She describes a cough and congestion with some blood tinge in her sputum.  She has been having diffuse anterior chest pain that is worse with inspiration and coughing.  She has been having low back pain bilaterally.  She endorses headache.  She took 1 g of Tylenol  today around 8 AM.  She has not taken anything else today for symptomatic relief.     Prior to Admission medications  Medication Sig Start Date End Date Taking? Authorizing Provider  amLODipine  (NORVASC ) 5 MG tablet Take 1 tablet (5 mg total) by mouth daily. 02/18/24 05/18/24 Yes Melvenia Motto, MD  ondansetron  (ZOFRAN -ODT) 4 MG disintegrating tablet Take 1 tablet (4 mg total) by mouth every 8 (eight) hours as needed. 02/18/24  Yes Melvenia Motto, MD  Ferrous Sulfate (IRON ) 325 MG TABS Take 1 tablet by mouth daily. 11/18/23   Signa Delon LABOR, NP  MAGNESIUM PO Take by mouth.    [provider]  megestrol  (MEGACE ) 40 MG tablet Take 3 x 5 days then 2 x 5 days then 1 daily till bleeding stops 12/18/23   Signa Delon LABOR, NP    Allergies: Patient has no known allergies.    Review of Systems  HENT:  Positive for congestion.   Respiratory:  Positive for cough and shortness of breath.   Cardiovascular:  Positive for chest pain.  Musculoskeletal:  Positive for back pain.  All other systems reviewed and are negative.   Updated Vital Signs BP 127/81   Pulse 84   Temp 98.5 F (36.9 C) (Oral)   Resp 20   Ht  5' 2 (1.575 m)   Wt 83.9 kg   SpO2 96%   BMI 33.84 kg/m   Physical Exam Vitals and nursing note reviewed.  Constitutional:      General: She is not in acute distress.    Appearance: She is well-developed. She is not ill-appearing, toxic-appearing or diaphoretic.  HENT:     Head: Normocephalic and atraumatic.     Mouth/Throat:     Mouth: Mucous membranes are moist.  Eyes:     Conjunctiva/sclera: Conjunctivae normal.  Cardiovascular:     Rate and Rhythm: Normal rate and regular rhythm.     Heart sounds: No murmur heard. Pulmonary:     Effort: Pulmonary effort is normal. No respiratory distress.     Breath sounds: Normal breath sounds. No wheezing, rhonchi or rales.  Chest:     Chest wall: No tenderness.  Abdominal:     Palpations: Abdomen is soft.     Tenderness: There is no abdominal tenderness.  Musculoskeletal:        General: No swelling.     Cervical back: Normal range of motion and neck supple.     Right lower leg: No edema.     Left lower leg: No edema.  Skin:    General: Skin is warm and dry.     Coloration: Skin is not cyanotic or pale.  Neurological:  General: No focal deficit present.     Mental Status: She is alert and oriented to person, place, and time.  Psychiatric:        Mood and Affect: Mood normal.        Behavior: Behavior normal.     (all labs ordered are listed, but only abnormal results are displayed) Labs Reviewed  RESP PANEL BY RT-PCR (RSV, FLU A&B, COVID)  RVPGX2 - Abnormal; Notable for the following components:      Result Value   Influenza A by PCR POSITIVE (*)    All other components within normal limits  BASIC METABOLIC PANEL WITH GFR - Abnormal; Notable for the following components:   CO2 20 (*)    Calcium 8.4 (*)    All other components within normal limits  CBC WITH DIFFERENTIAL/PLATELET - Abnormal; Notable for the following components:   RBC 5.66 (*)    MCV 72.3 (*)    MCH 22.4 (*)    RDW 20.0 (*)    All other components  within normal limits  HEPATIC FUNCTION PANEL - Abnormal; Notable for the following components:   Indirect Bilirubin 0.2 (*)    All other components within normal limits  URINALYSIS, ROUTINE W REFLEX MICROSCOPIC - Abnormal; Notable for the following components:   Color, Urine STRAW (*)    All other components within normal limits  PRO BRAIN NATRIURETIC PEPTIDE  HCG, SERUM, QUALITATIVE  POC URINE PREG, ED  TROPONIN T, HIGH SENSITIVITY    EKG: EKG Interpretation Date/Time:  Tuesday February 18 2024 11:03:28 EST Ventricular Rate:  85 PR Interval:  173 QRS Duration:  88 QT Interval:  367 QTC Calculation: 437 R Axis:   -52  Text Interpretation: Sinus rhythm Left anterior fascicular block Confirmed by Melvenia Motto ([id number]) on 02/18/2024 11:43:23 AM  Radiology: CT Angio Chest PE W and/or Wo Contrast Result Date: 02/18/2024 CLINICAL DATA:  Pulmonary embolism (PE) suspected, high prob EXAM: CT ANGIOGRAPHY CHEST WITH CONTRAST TECHNIQUE: Multidetector CT imaging through the chest was performed using the standard protocol during bolus administration of intravenous contrast. Multiplanar reconstructed images and MIPs were obtained and reviewed to evaluate the vascular anatomy. RADIATION DOSE REDUCTION: This exam was performed according to the departmental dose-optimization program which includes automated exposure control, adjustment of the mA and/or kV according to patient size and/or use of iterative reconstruction technique. CONTRAST:  75mL OMNIPAQUE  IOHEXOL  350 MG/ML SOLN COMPARISON:  None Available. FINDINGS: Pulmonary Embolism: No pulmonary embolism. Cardiovascular: No cardiomegaly or pericardial effusion. No aortic aneurysm, intramural hematoma, or aortic dissection. Mediastinum/Nodes: No mediastinal mass. No mediastinal, hilar, or axillary lymphadenopathy. Lungs/Pleura: The midline trachea and bronchi are patent. Posterior bibasilar dependent atelectasis. No focal airspace consolidation, pleural  effusion, or pneumothorax. Musculoskeletal: No acute fracture or destructive bone lesion. Multilevel thoracic osteophytosis. Upper Abdomen: No acute abnormality within the partially visualized upper abdomen. Review of the MIP images confirms the above findings. IMPRESSION: No acute intrathoracic abnormality; specifically, no pulmonary embolism, pneumonia, or pleural effusion. Electronically Signed   By: Rogelia Myers M.D.   On: 02/18/2024 13:49   CT Head Wo Contrast Result Date: 02/18/2024 CLINICAL DATA:  Worsening headache with cold-like symptoms 3 days. Shortness of breath. Chest pain. EXAM: CT HEAD WITHOUT CONTRAST TECHNIQUE: Contiguous axial images were obtained from the base of the skull through the vertex without intravenous contrast. RADIATION DOSE REDUCTION: This exam was performed according to the departmental dose-optimization program which includes automated exposure control, adjustment of the mA and/or kV according to patient  size and/or use of iterative reconstruction technique. COMPARISON:  06/08/2014 FINDINGS: Brain: Ventricles, cisterns and other CSF spaces are normal. There is no mass, mass effect, shift of midline structures or acute hemorrhage. No evidence of acute infarction. Vascular: No hyperdense vessel or unexpected calcification. Skull: Normal. Negative for fracture or focal lesion. Sinuses/Orbits: No acute finding. Other: None. IMPRESSION: No acute findings. Electronically Signed   By: Toribio Agreste M.D.   On: 02/18/2024 13:32   DG Chest Port 1 View Result Date: 02/18/2024 CLINICAL DATA:  Chest pain. EXAM: PORTABLE CHEST 1 VIEW COMPARISON:  02/23/2005 FINDINGS: Normal-sized heart. Clear lungs with normal vascularity. Unremarkable bones. IMPRESSION: No active disease. Electronically Signed   By: Elspeth Bathe M.D.   On: 02/18/2024 11:08     Procedures   Medications Ordered in the ED  oxyCODONE -acetaminophen  (PERCOCET/ROXICET) 5-325 MG per tablet 1 tablet (1 tablet Oral Given  02/18/24 1050)  metoCLOPramide  (REGLAN ) injection 10 mg (10 mg Intravenous Given 02/18/24 1103)  diphenhydrAMINE  (BENADRYL ) injection 12.5 mg (12.5 mg Intravenous Given 02/18/24 1102)  lactated ringers  bolus 500 mL (0 mLs Intravenous Stopped 02/18/24 1210)  ketorolac  (TORADOL ) 15 MG/ML injection 15 mg (15 mg Intravenous Given 02/18/24 1101)  dexamethasone  (DECADRON ) injection 10 mg (10 mg Intravenous Given 02/18/24 1100)  amLODipine  (NORVASC ) tablet 5 mg (5 mg Oral Given 02/18/24 1208)  iohexol  (OMNIPAQUE ) 350 MG/ML injection 75 mL (75 mLs Intravenous Contrast Given 02/18/24 1317)                                    Medical Decision Making Amount and/or Complexity of Data Reviewed Labs: ordered. Radiology: ordered.  Risk Prescription drug management.   This patient presents to the ED for concern of shortness of breath, this involves an extensive number of treatment options, and is a complaint that carries with it a high risk of complications and morbidity.  The differential diagnosis includes URI, pneumonia, PE, CHF, reactive airway disease, anemia, acidosis   Co morbidities / Chronic conditions that complicate the patient evaluation  prediabetes, migraines, HLD, GERD   Additional history obtained:  Additional history obtained from EMR External records from outside source obtained and reviewed including patient's daughter   Lab Tests:  I Ordered, and personally interpreted labs.  The pertinent results include: Positive for influenza A.  Remaining lab work unremarkable.   Imaging Studies ordered:  I ordered imaging studies including chest x-ray, CT head, CTA chest I independently visualized and interpreted imaging which showed no acute findings I agree with the radiologist interpretation   Cardiac Monitoring: / EKG:  The patient was maintained on a cardiac monitor.  I personally viewed and interpreted the cardiac monitored which showed an underlying rhythm of: Sinus  rhythm   Problem List / ED Course / Critical interventions / Medication management  Patient presenting for multiple complaints.  Among these are shortness of breath, cough, pleuritic chest pain, low back pain.  On arrival in the ED, she is overall well-appearing.  She has an ongoing cough present.  When not coughing, her breathing is unlabored.  Lungs are clear to auscultation.  Patient was treated with headache cocktail, IV fluids and Percocet.  Her serum lab work was unremarkable.  She did test positive for influenza A.  Imaging studies did not show any further acute findings to explain her symptoms.  She did have improved symptoms while in the ED.  She was advised to continue  supportive care at home.  She was discharged in stable condition. I ordered medication including Decadron , Toradol , Benadryl , Reglan  for headache; IV fluids for hydration; amlodipine  for HTN; Percocet for analgesia Reevaluation of the patient after these medicines showed that the patient improved I have reviewed the patients home medicines and have made adjustments as needed  Social Determinants of Health:  Limited English speaking     Final diagnoses:  Influenza A    ED Discharge Orders          Ordered    amLODipine  (NORVASC ) 5 MG tablet  Daily        02/18/24 1430    ondansetron  (ZOFRAN -ODT) 4 MG disintegrating tablet  Every 8 hours PRN        02/18/24 1430               Melvenia Motto, MD 02/18/24 1431

## 2024-02-18 NOTE — Discharge Instructions (Addendum)
 You have the flu.  Treatment for this is supportive care.  Continue ibuprofen  and Tylenol  for aches and pains.  Drink plenty of fluids to stay hydrated.  Your symptoms should improve over the next Avril days.  If you do have new or worsening symptoms of concern, return to the emergency department.    A prescription for medication called amlodipine  was sent to your pharmacy.  This is to treat high blood pressure.  Take daily and continue to monitor your blood pressure at home.  Follow-up with your primary care doctor for ongoing management of hypertension.  A prescription for medication called ondansetron  was also sent to your pharmacy.  Take this only as needed for nausea.

## 2024-02-18 NOTE — Telephone Encounter (Signed)
 Noted ED advised

## 2024-04-07 ENCOUNTER — Encounter: Payer: Self-pay | Admitting: Dermatology

## 2024-04-07 ENCOUNTER — Ambulatory Visit: Admitting: Dermatology

## 2024-04-07 DIAGNOSIS — L820 Inflamed seborrheic keratosis: Secondary | ICD-10-CM | POA: Diagnosis not present

## 2024-04-07 DIAGNOSIS — L821 Other seborrheic keratosis: Secondary | ICD-10-CM

## 2024-04-07 DIAGNOSIS — L578 Other skin changes due to chronic exposure to nonionizing radiation: Secondary | ICD-10-CM

## 2024-04-07 DIAGNOSIS — W908XXA Exposure to other nonionizing radiation, initial encounter: Secondary | ICD-10-CM | POA: Diagnosis not present

## 2024-04-07 DIAGNOSIS — Z79899 Other long term (current) drug therapy: Secondary | ICD-10-CM

## 2024-04-07 DIAGNOSIS — Z7189 Other specified counseling: Secondary | ICD-10-CM

## 2024-04-07 NOTE — Patient Instructions (Addendum)
 Voltaren cream ( found over the counter ) commonly used for joint aches - apply topically to dark areas on neck and face daily to help fade dark spots   Cryotherapy Aftercare  Wash gently with soap and water everyday.   Apply Vaseline and Band-Aid daily until healed.   Recommend moisturizing sun screen daily (samples given)  Recommend daily broad spectrum sunscreen SPF 30+ to sun-exposed areas, reapply every 2 hours as needed. Call for new or changing lesions.  Staying in the shade or wearing long sleeves, sun glasses (UVA+UVB protection) and wide brim hats (4-inch brim around the entire circumference of the hat) are also recommended for sun protection.    Seborrheic Keratosis  What causes seborrheic keratoses? Seborrheic keratoses are harmless, common skin growths that first appear during adult life.  As time goes by, more growths appear.  Some people may develop a large number of them.  Seborrheic keratoses appear on both covered and uncovered body parts.  They are not caused by sunlight.  The tendency to develop seborrheic keratoses can be inherited.  They vary in color from skin-colored to gray, brown, or even black.  They can be either smooth or have a rough, warty surface.   Seborrheic keratoses are superficial and look as if they were stuck on the skin.  Under the microscope this type of keratosis looks like layers upon layers of skin.  That is why at times the top layer may seem to fall off, but the rest of the growth remains and re-grows.    Treatment Seborrheic keratoses do not need to be treated, but can easily be removed in the office.  Seborrheic keratoses often cause symptoms when they rub on clothing or jewelry.  Lesions can be in the way of shaving.  If they become inflamed, they can cause itching, soreness, or burning.  Removal of a seborrheic keratosis can be accomplished by freezing, burning, or surgery. If any spot bleeds, scabs, or grows rapidly, please return to have it  checked, as these can be an indication of a skin cancer.       Due to recent changes in healthcare laws, you may see results of your pathology and/or laboratory studies on MyChart before the doctors have had a chance to review them. We understand that in some cases there may be results that are confusing or concerning to you. Please understand that not all results are received at the same time and often the doctors may need to interpret multiple results in order to provide you with the best plan of care or course of treatment. Therefore, we ask that you please give us  2 business days to thoroughly review all your results before contacting the office for clarification. Should we see a critical lab result, you will be contacted sooner.   If You Need Anything After Your Visit  If you have any questions or concerns for your doctor, please call our main line at [phone] and press option 4 to reach your doctor's medical assistant. If no one answers, please leave a voicemail as directed and we will return your call as soon as possible. Messages left after 4 pm will be answered the following business day.   You may also send us  a message via MyChart. We typically respond to MyChart messages within 1-2 business days.  For prescription refills, please ask your pharmacy to contact our office. Our fax number is [phone].  If you have an urgent issue when the clinic is closed that cannot  wait until the next business day, you can page your doctor at the number below.    Please note that while we do our best to be available for urgent issues outside of office hours, we are not available 24/7.   If you have an urgent issue and are unable to reach us , you may choose to seek medical care at your doctor's office, retail clinic, urgent care center, or emergency room.  If you have a medical emergency, please immediately call 911 or go to the emergency department.  Pager Numbers  - Dr. Hester:  [phone]  - Dr. Jackquline: [phone]  - Dr. Claudene: [phone]   - Dr. Raymund: [phone]  In the event of inclement weather, please call our main line at [phone] for an update on the status of any delays or closures.  Dermatology Medication Tips: Please keep the boxes that topical medications come in in order to help keep track of the instructions about where and how to use these. Pharmacies typically print the medication instructions only on the boxes and not directly on the medication tubes.   If your medication is too expensive, please contact our office at [phone] option 4 or send us  a message through MyChart.   We are unable to tell what your co-pay for medications will be in advance as this is different depending on your insurance coverage. However, we may be able to find a substitute medication at lower cost or fill out paperwork to get insurance to cover a needed medication.   If a prior authorization is required to get your medication covered by your insurance company, please allow us  1-2 business days to complete this process.  Drug prices often vary depending on where the prescription is filled and some pharmacies may offer cheaper prices.  The website www.goodrx.com contains coupons for medications through different pharmacies. The prices here do not account for what the cost may be with help from insurance (it may be cheaper with your insurance), but the website can give you the price if you did not use any insurance.  - You can print the associated coupon and take it with your prescription to the pharmacy.  - You may also stop by our office during regular business hours and pick up a GoodRx coupon card.  - If you need your prescription sent electronically to a different pharmacy, notify our office through [facility] or by phone at [phone] option 4.     Si Usted Necesita Algo Despus de Su Visita  Tambin puede enviarnos un mensaje a travs  de Clinical Cytogeneticist. Por lo general respondemos a los mensajes de MyChart en el transcurso de 1 a 2 das hbiles.  Para renovar recetas, por favor pida a su farmacia que se ponga en contacto con nuestra oficina. Randi lakes de fax es [place] [phone].  Si tiene un asunto urgente cuando la clnica est cerrada y que no puede esperar hasta el siguiente da hbil, puede llamar/localizar a su doctor(a) al nmero que aparece a continuacin.   Por favor, tenga en cuenta que aunque hacemos todo lo posible para estar disponibles para asuntos urgentes fuera del horario de [place], no estamos disponibles las 24 horas del da, los 7 [address] de la [place].   Si tiene un problema urgente y no puede comunicarse con nosotros, puede optar por buscar atencin mdica  en el consultorio de su doctor(a), en una clnica privada, en un centro de atencin urgente o en una sala de emergencias.  Si tiene  una emergencia mdica, por favor llame inmediatamente al 911 o vaya a la sala de emergencias.  Nmeros de bper  - Dr. Hester: [phone]  - Dra. Jackquline: [phone]  - Dr. Claudene: [phone]  - Dra. Kitts: [phone]  En caso de inclemencias del [place], por favor llame a nuestra lnea principal al [phone] para una actualizacin sobre el estado de cualquier retraso o cierre.  Consejos para la medicacin en dermatologa: Por favor, guarde las cajas en las que vienen los medicamentos de uso tpico para ayudarle a seguir las instrucciones sobre dnde y cmo usarlos. Las farmacias generalmente imprimen las instrucciones del medicamento slo en las cajas y no directamente en los tubos del [place].   Si su medicamento es muy caro, por favor, pngase en contacto con landry rieger llamando al [phone] y presione la opcin 4 o envenos un mensaje a travs de Clinical Cytogeneticist.   No podemos decirle cul ser su copago por los medicamentos por adelantado ya que esto es diferente dependiendo de la cobertura de su seguro.  Sin embargo, es posible que podamos encontrar un medicamento sustituto a audiological scientist un formulario para que el seguro cubra el medicamento que se considera necesario.   Si se requiere una autorizacin previa para que su compaa de seguros cubra su medicamento, por favor permtanos de 1 a 2 das hbiles para completar este proceso.  Los precios de los medicamentos varan con frecuencia dependiendo del environmental consultant de dnde se surte la receta y alguna farmacias pueden ofrecer precios ms baratos.  El sitio web www.goodrx.com tiene cupones para medicamentos de health and safety inspector. Los precios aqu no tienen en cuenta lo que podra costar con la ayuda del seguro (puede ser ms barato con su seguro), pero el sitio web puede darle el precio si no utiliz tourist information centre manager.  - Puede imprimir el cupn correspondiente y llevarlo con su receta a la farmacia.  - Tambin puede pasar por nuestra oficina durante el horario de atencin regular y education officer, museum una tarjeta de cupones de GoodRx.  - Si necesita que su receta se enve electrnicamente a una farmacia diferente, informe a nuestra oficina a travs de MyChart de [facility] o por telfono llamando al [phone] y presione la opcin 4.

## 2024-10-05 ENCOUNTER — Ambulatory Visit: Admitting: Dermatology

## 2025-04-07 ENCOUNTER — Ambulatory Visit: Admitting: Dermatology
